# Patient Record
Sex: Male | Born: 1945 | Race: White | Hispanic: No | Smoking: Never smoker
Health system: Southern US, Community
[De-identification: ages and names within clinical notes are randomized; demographics above are authoritative.]

## PROBLEM LIST (undated history)

## (undated) DIAGNOSIS — I6381 Other cerebral infarction due to occlusion or stenosis of small artery: Secondary | ICD-10-CM

## (undated) DIAGNOSIS — I639 Cerebral infarction, unspecified: Secondary | ICD-10-CM

## (undated) HISTORY — PX: BACK SURGERY: SHX140

## (undated) HISTORY — DX: Other cerebral infarction due to occlusion or stenosis of small artery: I63.81

## (undated) HISTORY — PX: KNEE SURGERY: SHX244

## (undated) HISTORY — PX: ANKLE SURGERY: SHX546

## (undated) HISTORY — PX: SHOULDER SURGERY: SHX246

---

## 2005-09-19 DIAGNOSIS — R200 Anesthesia of skin: Secondary | ICD-10-CM

## 2005-09-19 HISTORY — DX: Anesthesia of skin: R20.0

## 2005-11-04 DIAGNOSIS — M47816 Spondylosis without myelopathy or radiculopathy, lumbar region: Secondary | ICD-10-CM | POA: Insufficient documentation

## 2005-11-04 HISTORY — DX: Spondylosis without myelopathy or radiculopathy, lumbar region: M47.816

## 2006-11-26 DIAGNOSIS — M542 Cervicalgia: Secondary | ICD-10-CM

## 2006-11-26 DIAGNOSIS — M9901 Segmental and somatic dysfunction of cervical region: Secondary | ICD-10-CM | POA: Insufficient documentation

## 2006-11-26 DIAGNOSIS — M47812 Spondylosis without myelopathy or radiculopathy, cervical region: Secondary | ICD-10-CM | POA: Insufficient documentation

## 2006-11-26 HISTORY — DX: Spondylosis without myelopathy or radiculopathy, cervical region: M47.812

## 2006-11-26 HISTORY — DX: Segmental and somatic dysfunction of cervical region: M99.01

## 2006-11-26 HISTORY — DX: Cervicalgia: M54.2

## 2008-01-28 DIAGNOSIS — M2550 Pain in unspecified joint: Secondary | ICD-10-CM | POA: Insufficient documentation

## 2008-01-28 HISTORY — DX: Pain in unspecified joint: M25.50

## 2008-03-07 DIAGNOSIS — C4490 Unspecified malignant neoplasm of skin, unspecified: Secondary | ICD-10-CM | POA: Insufficient documentation

## 2008-03-07 DIAGNOSIS — L570 Actinic keratosis: Secondary | ICD-10-CM

## 2008-03-07 HISTORY — DX: Actinic keratosis: L57.0

## 2008-03-07 HISTORY — DX: Unspecified malignant neoplasm of skin, unspecified: C44.90

## 2008-05-23 DIAGNOSIS — D126 Benign neoplasm of colon, unspecified: Secondary | ICD-10-CM | POA: Insufficient documentation

## 2008-05-23 HISTORY — DX: Benign neoplasm of colon, unspecified: D12.6

## 2008-06-01 DIAGNOSIS — M353 Polymyalgia rheumatica: Secondary | ICD-10-CM

## 2008-06-01 HISTORY — DX: Polymyalgia rheumatica: M35.3

## 2008-10-14 DIAGNOSIS — R5383 Other fatigue: Secondary | ICD-10-CM

## 2008-10-14 HISTORY — DX: Other fatigue: R53.83

## 2009-06-14 DIAGNOSIS — M7710 Lateral epicondylitis, unspecified elbow: Secondary | ICD-10-CM

## 2009-06-14 HISTORY — DX: Lateral epicondylitis, unspecified elbow: M77.10

## 2014-04-01 DIAGNOSIS — G4700 Insomnia, unspecified: Secondary | ICD-10-CM

## 2014-04-01 HISTORY — DX: Insomnia, unspecified: G47.00

## 2014-07-18 DIAGNOSIS — D485 Neoplasm of uncertain behavior of skin: Secondary | ICD-10-CM

## 2014-07-18 HISTORY — DX: Neoplasm of uncertain behavior of skin: D48.5

## 2014-08-01 DIAGNOSIS — H269 Unspecified cataract: Secondary | ICD-10-CM | POA: Insufficient documentation

## 2014-11-07 DIAGNOSIS — F1020 Alcohol dependence, uncomplicated: Secondary | ICD-10-CM | POA: Insufficient documentation

## 2014-11-07 DIAGNOSIS — R413 Other amnesia: Secondary | ICD-10-CM | POA: Insufficient documentation

## 2014-11-07 HISTORY — DX: Alcohol dependence, uncomplicated: F10.20

## 2014-11-10 DIAGNOSIS — H2513 Age-related nuclear cataract, bilateral: Secondary | ICD-10-CM

## 2014-11-10 DIAGNOSIS — H25099 Other age-related incipient cataract, unspecified eye: Secondary | ICD-10-CM | POA: Insufficient documentation

## 2014-11-10 HISTORY — DX: Age-related nuclear cataract, bilateral: H25.13

## 2015-06-19 DIAGNOSIS — R6889 Other general symptoms and signs: Secondary | ICD-10-CM | POA: Insufficient documentation

## 2015-10-09 DIAGNOSIS — K648 Other hemorrhoids: Secondary | ICD-10-CM

## 2015-10-09 DIAGNOSIS — K5730 Diverticulosis of large intestine without perforation or abscess without bleeding: Secondary | ICD-10-CM

## 2015-10-09 HISTORY — DX: Diverticulosis of large intestine without perforation or abscess without bleeding: K57.30

## 2015-10-09 HISTORY — DX: Other hemorrhoids: K64.8

## 2016-09-27 DIAGNOSIS — G8929 Other chronic pain: Secondary | ICD-10-CM | POA: Insufficient documentation

## 2016-09-27 HISTORY — DX: Other chronic pain: G89.29

## 2017-06-09 DIAGNOSIS — R29818 Other symptoms and signs involving the nervous system: Secondary | ICD-10-CM | POA: Insufficient documentation

## 2017-11-12 DIAGNOSIS — S66819A Strain of other specified muscles, fascia and tendons at wrist and hand level, unspecified hand, initial encounter: Secondary | ICD-10-CM | POA: Insufficient documentation

## 2017-11-12 DIAGNOSIS — S6440XA Injury of digital nerve of unspecified finger, initial encounter: Secondary | ICD-10-CM

## 2017-11-12 HISTORY — DX: Strain of other specified muscles, fascia and tendons at wrist and hand level, unspecified hand, initial encounter: S66.819A

## 2017-11-12 HISTORY — DX: Injury of digital nerve of unspecified finger, initial encounter: S64.40XA

## 2018-02-03 ENCOUNTER — Encounter (HOSPITAL_COMMUNITY): Payer: Self-pay

## 2018-02-03 ENCOUNTER — Emergency Department (HOSPITAL_COMMUNITY)
Admission: EM | Admit: 2018-02-03 | Discharge: 2018-02-03 | Disposition: A | Discharge status: Home / Self Care | Payer: Medicare Other | Attending: Emergency Medicine | Admitting: Emergency Medicine

## 2018-02-03 ENCOUNTER — Emergency Department (HOSPITAL_COMMUNITY): Payer: Medicare Other

## 2018-02-03 DIAGNOSIS — Y999 Unspecified external cause status: Secondary | ICD-10-CM | POA: Insufficient documentation

## 2018-02-03 DIAGNOSIS — W182XXA Fall in (into) shower or empty bathtub, initial encounter: Secondary | ICD-10-CM | POA: Diagnosis not present

## 2018-02-03 DIAGNOSIS — M542 Cervicalgia: Secondary | ICD-10-CM

## 2018-02-03 DIAGNOSIS — R51 Headache: Secondary | ICD-10-CM | POA: Diagnosis not present

## 2018-02-03 DIAGNOSIS — S0990XA Unspecified injury of head, initial encounter: Secondary | ICD-10-CM

## 2018-02-03 DIAGNOSIS — Y939 Activity, unspecified: Secondary | ICD-10-CM | POA: Diagnosis not present

## 2018-02-03 DIAGNOSIS — Y929 Unspecified place or not applicable: Secondary | ICD-10-CM | POA: Insufficient documentation

## 2018-02-03 DIAGNOSIS — W19XXXA Unspecified fall, initial encounter: Secondary | ICD-10-CM

## 2018-02-03 HISTORY — DX: Cerebral infarction, unspecified: I63.9

## 2018-02-03 MED ORDER — HYDROCODONE-ACETAMINOPHEN 5-325 MG PO TABS: 2.0000 | ORAL_TABLET | ORAL | 0 refills | Status: DC | PRN

## 2018-02-03 MED ORDER — MORPHINE SULFATE (PF) 4 MG/ML IV SOLN
4.0000 mg | Freq: Once | INTRAVENOUS | Status: AC
  Administered 2018-02-03: 4 mg via INTRAMUSCULAR
  Filled 2018-02-03: qty 1

## 2018-02-03 NOTE — ED Provider Notes (Signed)
[facility]-EMERGENCY DEPT Provider Note   CSN: [id number] Arrival date & time: 02/03/18  [id number]     History   Chief Complaint Chief Complaint  Patient presents with  . Fall    HPI Seth ParkinsJohn Bryant is a 72 y.o. male.  72 year old male with prior history as detailed below presents after reported fall.  Patient reports that he fell 3 days prior to struck his head.  He complains of diffuse headache and neck pain following the fall.  He denies loss of conscious.  He denies focal weakness, chest pain, shortness of breath, or other significant injury.  Patient reports that he is in [place] from [place].  He reports that his chronic pain medications are being sent to him by his wife from [place].  He reports that he is out of his current pain medications.  Care everywhere review does reveal recent MRI imaging of his cervical spine in [place] within the last month. No significant abnormality found at that time.   The history is provided by the patient, a relative and medical records.  Fall  This is a new problem. The current episode started more than 2 days ago. The problem occurs rarely. The problem has not changed since onset.Pertinent negatives include no chest pain and no abdominal pain. Exacerbated by: movement. Nothing relieves the symptoms.    Past Medical History:  Diagnosis Date  . mini stroke     There are no active problems to display for this patient.   Past Surgical History:  Procedure Laterality Date  . ANKLE SURGERY    . BACK SURGERY    . KNEE SURGERY    . SHOULDER SURGERY          Home Medications    Prior to Admission medications   Medication Sig Start Date End Date Taking? Authorizing Provider  HYDROcodone-acetaminophen (NORCO/VICODIN) 5-325 MG tablet Take 2 tablets by mouth every 4 (four) hours as needed. 02/03/18   Wynetta FinesMessick, Peter C, MD    Family History History reviewed. No pertinent family  history.  Social History Social History   Tobacco Use  . Smoking status: Never Smoker  . Smokeless tobacco: Never Used  Substance Use Topics  . Alcohol use: Yes  . Drug use: Never     Allergies   Latex   Review of Systems Review of Systems  Cardiovascular: Negative for chest pain.  Gastrointestinal: Negative for abdominal pain.  Musculoskeletal:       Neck pain   All other systems reviewed and are negative.    Physical Exam Updated Vital Signs BP (!) 158/92 (BP Location: Left Arm)   Pulse (!) 53   Temp 97.7 F (36.5 C) (Oral)   Resp 16   Ht 5' 5.5" (1.664 m)   Wt 77.1 kg (170 lb)   SpO2 100%   BMI 27.86 kg/m   Physical Exam  Constitutional: He is oriented to person, place, and time. He appears well-developed and well-nourished. No distress.  HENT:  Head: Normocephalic and atraumatic.  Mouth/Throat: Oropharynx is clear and moist.  Cervical collar in place  Patient reports diffuse posterior and lateral neck tenderness.  Eyes: Pupils are equal, round, and reactive to light. Conjunctivae and EOM are normal.  Neck: Normal range of motion. Neck supple.  Cardiovascular: Normal rate, regular rhythm and normal heart sounds.  Pulmonary/Chest: Effort normal and breath sounds normal. No respiratory distress.  Abdominal: Soft. He exhibits no distension. There is no tenderness.  Musculoskeletal: Normal range  of motion. He exhibits no edema or deformity.  Neurological: He is alert and oriented to person, place, and time.  Alert and oriented x4 Normal speech No facial droop 5 out of 5 strength of both upper and lower extremities   Skin: Skin is warm and dry.  Psychiatric: He has a normal mood and affect.  Nursing note and vitals reviewed.    ED Treatments / Results  Labs (all labs ordered are listed, but only abnormal results are displayed) Labs Reviewed - No data to display  EKG None  Radiology Ct Head Wo Contrast  Result Date: 02/03/2018 CLINICAL DATA:   Fall, hit back of head. EXAM: CT HEAD WITHOUT CONTRAST CT CERVICAL SPINE WITHOUT CONTRAST TECHNIQUE: Multidetector CT imaging of the head and cervical spine was performed following the standard protocol without intravenous contrast. Multiplanar CT image reconstructions of the cervical spine were also generated. COMPARISON:  None. FINDINGS: CT HEAD FINDINGS Brain: Age related volume loss. 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 No acute intracranial abnormality. Specifically, no hemorrhage, hydrocephalus, mass lesion, acute infarction, or significant intracranial injury. Vascular: No hyperdense vessel or unexpected calcification. Skull: No acute calvarial abnormality. Sinuses/Orbits: No acute findings Other: None CT CERVICAL SPINE FINDINGS Alignment: No subluxation Skull base and vertebrae: No fracture Soft tissues and spinal canal: No prevertebral fluid or swelling. No visible canal hematoma. Disc levels: Disc space narrowing at C5-6 through C7-T1. Mild degenerative facet disease bilaterally, left greater than right. Upper chest: No acute findings Other: No acute findings bilateral thyroid nodules noted. IMPRESSION: No acute intracranial abnormality. 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 No acute bony abnormality. 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 Electronically Signed   By: Charlett Nose M.D.   On: 02/03/2018 10:44   Ct Cervical Spine Wo Contrast  Result Date: 02/03/2018 CLINICAL DATA:  Fall, hit back of head. EXAM: CT HEAD WITHOUT CONTRAST CT CERVICAL SPINE WITHOUT CONTRAST TECHNIQUE: Multidetector CT imaging of the head and cervical spine was performed following the standard protocol without intravenous contrast. Multiplanar CT image reconstructions of the cervical spine were also generated. COMPARISON:  None. FINDINGS: CT HEAD FINDINGS Brain: Age related volume loss. 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 No acute intracranial abnormality.  Specifically, no hemorrhage, hydrocephalus, mass lesion, acute infarction, or significant intracranial injury. Vascular: No hyperdense vessel or unexpected calcification. Skull: No acute calvarial abnormality. Sinuses/Orbits: No acute findings Other: None CT CERVICAL SPINE FINDINGS Alignment: No subluxation Skull base and vertebrae: No fracture Soft tissues and spinal canal: No prevertebral fluid or swelling. No visible canal hematoma. Disc levels: Disc space narrowing at C5-6 through C7-T1. Mild degenerative facet disease bilaterally, left greater than right. Upper chest: No acute findings Other: No acute findings bilateral thyroid nodules noted. IMPRESSION: No acute intracranial abnormality. 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 No acute bony abnormality. 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 Electronically Signed   By: Charlett Nose M.D.   On: 02/03/2018 10:44    Procedures Procedures (including critical care time)  Medications Ordered in ED Medications  morphine 4 MG/ML injection 4 mg (4 mg Intramuscular Given 02/03/18 0600)     Initial Impression / Assessment and Plan / ED Course  I have reviewed the triage vital signs and the nursing notes.  Pertinent labs & imaging results that were available during my care of the patient were reviewed by me and considered in my medical decision making (see chart for details).  MDM  Screen complete  Patient is presenting for evaluation following a fall with head injury and neck pain.  Screening CT scans do not reveal significant acute traumatic injury.  Patient is improved following treatment in the ED.  He desires discharge.  Patient will be given a small prescription of narcotics for home use.  It appears that his routine chronic pain meds are still in [place].  Importance of close follow-up was stressed.  Strict return precautions are given and understood.  Final Clinical Impressions(s) / ED  Diagnoses   Final diagnoses:  Fall, initial encounter  Injury of head, initial encounter  Neck pain    ED Discharge Orders        Ordered    HYDROcodone-acetaminophen (NORCO/VICODIN) 5-325 MG tablet  Every 4 hours PRN     02/03/18 1148       Wynetta Fines, MD 02/03/18 1220

## 2018-02-03 NOTE — ED Triage Notes (Signed)
Pt presents with c/o fall that occurred on Saturday attempting to get out of the bath tub. Pt c/o neck pain, holding his neck and in obvious pain upon arrival. C-collar placed on patient as he was barely able to walk without holding onto his neck. Pt reports that any movement is painful.

## 2018-02-03 NOTE — Discharge Instructions (Addendum)
Please return for any problem.  Follow-up with your regular doctor as instructed. °

## 2019-02-11 DIAGNOSIS — L299 Pruritus, unspecified: Secondary | ICD-10-CM | POA: Insufficient documentation

## 2019-03-04 DIAGNOSIS — R972 Elevated prostate specific antigen [PSA]: Secondary | ICD-10-CM

## 2019-03-04 HISTORY — DX: Elevated prostate specific antigen (PSA): R97.20

## 2019-05-06 ENCOUNTER — Emergency Department (HOSPITAL_COMMUNITY): Payer: Medicare Other

## 2019-05-06 ENCOUNTER — Emergency Department (HOSPITAL_COMMUNITY)
Admission: EM | Admit: 2019-05-06 | Discharge: 2019-05-06 | Discharge status: Against Medical Advice | Payer: Medicare Other | Attending: Emergency Medicine | Admitting: Emergency Medicine

## 2019-05-06 ENCOUNTER — Encounter (HOSPITAL_COMMUNITY): Payer: Self-pay

## 2019-05-06 DIAGNOSIS — Z8673 Personal history of transient ischemic attack (TIA), and cerebral infarction without residual deficits: Secondary | ICD-10-CM | POA: Insufficient documentation

## 2019-05-06 DIAGNOSIS — Z5320 Procedure and treatment not carried out because of patient's decision for unspecified reasons: Secondary | ICD-10-CM | POA: Diagnosis not present

## 2019-05-06 DIAGNOSIS — R200 Anesthesia of skin: Secondary | ICD-10-CM | POA: Diagnosis not present

## 2019-05-06 DIAGNOSIS — R42 Dizziness and giddiness: Secondary | ICD-10-CM | POA: Diagnosis not present

## 2019-05-06 LAB — CBC
HCT: 50.8 % (ref 39.0–52.0)
Hemoglobin: 16.6 g/dL (ref 13.0–17.0)
MCH: 30.6 pg (ref 26.0–34.0)
MCHC: 32.7 g/dL (ref 30.0–36.0)
MCV: 93.6 fL (ref 80.0–100.0)
Platelets: 173 10*3/uL (ref 150–400)
RBC: 5.43 MIL/uL (ref 4.22–5.81)
RDW: 13 % (ref 11.5–15.5)
WBC: 6.8 10*3/uL (ref 4.0–10.5)
nRBC: 0 % (ref 0.0–0.2)

## 2019-05-06 LAB — COMPREHENSIVE METABOLIC PANEL
ALT: 20 U/L (ref 0–44)
AST: 26 U/L (ref 15–41)
Albumin: 4.8 g/dL (ref 3.5–5.0)
Alkaline Phosphatase: 81 U/L (ref 38–126)
Anion gap: 10 (ref 5–15)
BUN: 12 mg/dL (ref 8–23)
CO2: 27 mmol/L (ref 22–32)
Calcium: 9.4 mg/dL (ref 8.9–10.3)
Chloride: 101 mmol/L (ref 98–111)
Creatinine, Ser: 1.05 mg/dL (ref 0.61–1.24)
GFR calc Af Amer: >60 mL/min (ref >60)
GFR calc non Af Amer: >60 mL/min (ref >60)
Glucose, Bld: 83 mg/dL (ref 70–99)
Potassium: 4.2 mmol/L (ref 3.5–5.1)
Sodium: 138 mmol/L (ref 135–145)
Total Bilirubin: 0.4 mg/dL (ref 0.3–1.2)
Total Protein: 8.3 g/dL — ABNORMAL HIGH (ref 6.5–8.1)

## 2019-05-06 LAB — DIFFERENTIAL
Abs Immature Granulocytes: 0.03 10*3/uL (ref 0.00–0.07)
Basophils Absolute: 0 10*3/uL (ref 0.0–0.1)
Basophils Relative: 1 %
Eosinophils Absolute: 0 10*3/uL (ref 0.0–0.5)
Eosinophils Relative: 1 %
Immature Granulocytes: 0 %
Lymphocytes Relative: 23 %
Lymphs Abs: 1.6 10*3/uL (ref 0.7–4.0)
Monocytes Absolute: 0.6 10*3/uL (ref 0.1–1.0)
Monocytes Relative: 8 %
Neutro Abs: 4.6 10*3/uL (ref 1.7–7.7)
Neutrophils Relative %: 67 %

## 2019-05-06 LAB — TROPONIN I (HIGH SENSITIVITY): Troponin I (High Sensitivity): 5 ng/L (ref <18)

## 2019-05-06 LAB — PROTIME-INR
INR: 1 (ref 0.8–1.2)
Prothrombin Time: 13.3 seconds (ref 11.4–15.2)

## 2019-05-06 LAB — CBG MONITORING, ED: Glucose-Capillary: 81 mg/dL (ref 70–99)

## 2019-05-06 LAB — APTT: aPTT: 30 seconds (ref 24–36)

## 2019-05-06 MED ORDER — SODIUM CHLORIDE 0.9% FLUSH: 3.0000 mL | Freq: Once | INTRAVENOUS | Status: DC

## 2019-05-06 NOTE — ED Provider Notes (Signed)
[name] Provider Note   CSN: [id number] Arrival date & time: 05/06/19  2043     History   Chief Complaint Numbness in arms  HPI Seth Bryant is a 73 y.o. male presenting to the emergency department with concern for possible mini stroke.  The patient reports that he was at home lying in the bed resting.  He states approximate 1 hour prior to arrival, he began experiencing numbness in his left hand and arm which traveled up to his shoulder.  He also experienced some numbness following this extending into his right arm.  He states this lasted a few minutes and is resolved completely by the time he came to the emergency department.  He states he has had similar symptoms in the past, most recently 5 months ago, where he had the same symptoms as well as numbness and tingling in his left lower face.  He went to an urgent care at the time and was referred to a main hospital ER, but decided to go home as his symptoms had resolved.  His primary care physician subsequently told him that he may have had a mini stroke.  He does not take aspirin or any blood thinners at home.  Tonight after symptoms began he did take 2 baby aspirin prior to coming to the ED.  He denies any associated palpitations, shortness of breath, lightheadedness, chest pain, syncope.  He denies any prior history of stroke or A. Fib.  He does report a distant past that he was told he has "skipped heartbeats".   HPI  Past Medical History:  Diagnosis Date   mini stroke     There are no active problems to display for this patient.   Past Surgical History:  Procedure Laterality Date   ANKLE SURGERY     BACK SURGERY     KNEE SURGERY     SHOULDER SURGERY          Home Medications    Prior to Admission medications   Medication Sig Start Date End Date Taking? Authorizing Provider  HYDROcodone-acetaminophen (NORCO/VICODIN) 5-325 MG tablet Take 2 tablets by mouth every 4 (four)  hours as needed. 02/03/18   Valarie Merino, MD    Family History History reviewed. No pertinent family history.  Social History Social History   Tobacco Use   Smoking status: Never Smoker   Smokeless tobacco: Never Used  Substance Use Topics   Alcohol use: Yes   Drug use: Never     Allergies   Latex   Review of Systems Review of Systems  Constitutional: Negative for chills and fever.  Eyes: Negative for photophobia and visual disturbance.  Respiratory: Negative for cough and shortness of breath.   Cardiovascular: Negative for chest pain and palpitations.  Gastrointestinal: Negative for abdominal pain, nausea and vomiting.  Musculoskeletal: Negative for neck pain and neck stiffness.  Skin: Negative for pallor and rash.  Neurological: Positive for dizziness, weakness, light-headedness and numbness. Negative for syncope, facial asymmetry, speech difficulty and headaches.  Psychiatric/Behavioral: Negative for agitation and confusion.  All other systems reviewed and are negative.    Physical Exam Updated Vital Signs BP 113/70    Pulse 64    Temp 98.3 F (36.8 C) (Oral)    Resp 10    SpO2 99%   Physical Exam Vitals signs and nursing note reviewed.  Constitutional:      Appearance: He is well-developed.  HENT:     Head: Normocephalic and atraumatic.  Eyes:     Conjunctiva/sclera: Conjunctivae normal.  Neck:     Musculoskeletal: Neck supple.  Cardiovascular:     Rate and Rhythm: Normal rate and regular rhythm.     Heart sounds: No murmur.  Pulmonary:     Effort: Pulmonary effort is normal. No respiratory distress.     Breath sounds: Normal breath sounds.  Abdominal:     Palpations: Abdomen is soft.     Tenderness: There is no abdominal tenderness.  Skin:    General: Skin is warm and dry.  Neurological:     General: No focal deficit present.     Mental Status: He is alert and oriented to person, place, and time.     GCS: GCS eye subscore is 4. GCS verbal  subscore is 5. GCS motor subscore is 6.     Cranial Nerves: Cranial nerves are intact.     Sensory: Sensation is intact.     Motor: Motor function is intact.     Coordination: Coordination is intact.     Gait: Gait is intact.  Psychiatric:        Mood and Affect: Mood normal.        Behavior: Behavior normal.      ED Treatments / Results  Labs (all labs ordered are listed, but only abnormal results are displayed) Labs Reviewed  COMPREHENSIVE METABOLIC PANEL - Abnormal; Notable for the following components:      Result Value   Total Protein 8.3 (*)    All other components within normal limits  I-STAT CHEM 8, ED - Abnormal; Notable for the following components:   Potassium 6.9 (*)    Calcium, Ion 1.06 (*)    All other components within normal limits  PROTIME-INR  APTT  CBC  DIFFERENTIAL  CBG MONITORING, ED  TROPONIN I (HIGH SENSITIVITY)    EKG None  Radiology Ct Head Wo Contrast  Result Date: 05/06/2019 CLINICAL DATA:  Initial evaluation for acute numbness, tingling, paresthesias, dizziness. EXAM: CT HEAD WITHOUT CONTRAST TECHNIQUE: Contiguous axial images were obtained from the base of the skull through the vertex without intravenous contrast. COMPARISON:  Prior CT from 02/03/2018. FINDINGS: Brain: Generalized age-related cerebral atrophy with mild chronic small vessel ischemic disease. Few scattered chronic lacunar infarcts noted within the left basal ganglia. No acute intracranial hemorrhage. No acute large vessel territory infarct. No mass lesion, midline shift or mass effect no hydrocephalus. No extra-axial fluid collection. Vascular: No hyperdense vessel. Scattered vascular calcifications noted within the carotid siphons. Skull: Scalp soft tissues and calvarium within normal limits. Sinuses/Orbits: Globes orbital soft tissues within normal limits. Mild scattered mucosal thickening noted within the ethmoidal air cells. Paranasal sinuses are otherwise clear. No mastoid  effusion. Other: None. IMPRESSION: 1. No acute intracranial abnormality. 2. Generalized age-related cerebral atrophy with mild chronic small vessel ischemic disease. Electronically Signed   By: Rise MuBenjamin  McClintock M.D.   On: 05/06/2019 21:42    Procedures Procedures (including critical care time)  Medications Ordered in ED Medications  sodium chloride flush (NS) 0.9 % injection 3 mL (0 mLs Intravenous Hold 05/06/19 2123)     Initial Impression / Assessment and Plan / ED Course  I have reviewed the triage vital signs and the nursing notes.  Pertinent labs & imaging results that were available during my care of the patient were reviewed by me and considered in my medical decision making (see chart for details).  73 year old gentleman presented emergency department with transient episode of numbness in his left  arm radiating up to his shoulder, subsequently crossing over and radiating down his right arm.  This was associated with some brief vertigo and lightheadedness.  This occurred while he was at rest at home.  This occurred approximate 1 hour prior to arrival.  In the ED the patient is asymptomatic and states symptoms have resolved.  CT scan does not show any evidence of acute infarct, possible older lacunar infarcts.  No remarkable findings on initial lab work.  The patient is asymptomatic with benign neurological exam.  He has an ABCD2 score of 2.  However, I think his history is not quite consistent with a TIA.  Bilateral nature of his symptoms makes this less likely.  However I will discuss with neurology, and given his prior recurrent episodes, I do believe it is reasonable to pursue further work-up.  Also on differential is arrhythmia versus acute coronary syndrome versus other.  Clinical Course as of May 06 32  Thu May 06, 2019  2154 Istat K 6.9, suspected lab error, K> 4.2 on CMP   [MT]  2156 IMPRESSION: 1. No acute intracranial abnormality. 2. Generalized age-related  cerebral atrophy with mild chronic small vessel ischemic disease.   [MT]  2246 Patient reports that he does not want a wait overnight for further testing.  He states he is concerned because he has to take care of his 80 year old mother at home.  He states he feels fine now.  I explained to him that if he had a TIA or a cardiac event, this raises his risk of having either a full-blown stroke or a lethal arrhythmia or heart attack.  I told my medical recommendation were to stay in the hospital and have further work-up including h CT angiogram possible MRI of the brain.  He states he understands the risks of  this conditions, including death or permanent disability, but still wishes to go home.  He states he will follow up with his own doctor.  He would like to follow-up with cardiology as an outpatient.  I will start him on baby aspirin in the meantime.  Advised him if he has any recurrent symptoms, he needs to come immediately to the emergency department, and that the longer he waits at home, the greater the risk for irreversible damage.  He states he understand this and still wishes to go home.   [MT]    Clinical Course User Index [MT] Renaye Rakers Kermit Balo, MD     Final Clinical Impressions(s) / ED Diagnoses   Final diagnoses:  Arm numbness left  Right arm numbness  Vertigo    ED Discharge Orders    None       Kenyada Dosch, Kermit Balo, MD 05/07/19 [phone]

## 2019-05-06 NOTE — ED Triage Notes (Signed)
Pt complains of bilateral arm and face numbness about 30 minutes, he states that he's had it before and dx with a TIA, pt doesn't have slurred speech at present

## 2019-05-06 NOTE — ED Notes (Addendum)
Pt. Documented in error NIH Stroke Scale.

## 2019-05-06 NOTE — ED Notes (Signed)
Notified MD of Chem-8 results- Potassium reading 6.55mmol/L. Nancy Nordmann

## 2019-05-06 NOTE — Discharge Instructions (Addendum)
You were seen in the hospital after having an episode at home of numbness in your left arm followed by lightheadedness and dizziness.  As I explained, this presentation could be concerning for a mini stroke, or else an arrhythmia (dangerous rhythm) of your heart.  Even though we did not see any active signs of heart attack or stroke on your scans or work-up, you are still at risk for developing these conditions.  I recommended that he stay in the hospital for an MRI or further imaging of your brain as well as monitoring of your heart.  You stated that you could not stay in the hospital and you wished to go home.  You told us that you understood the risks of leaving before completing your full medical workup, including stroke, heart attack, permanent disability, or death.  I recommended that you start taking aspirin 81 mg daily, and follow up with your doctor for an outpatient MRI.  You should also follow up with a cardiologist for evaluation for your lightheadedness with your episodes.

## 2019-05-07 LAB — I-STAT CHEM 8, ED
BUN: 16 mg/dL (ref 8–23)
Calcium, Ion: 1.06 mmol/L — ABNORMAL LOW (ref 1.15–1.40)
Chloride: 101 mmol/L (ref 98–111)
Creatinine, Ser: 1.1 mg/dL (ref 0.61–1.24)
Glucose, Bld: 80 mg/dL (ref 70–99)
HCT: 44 % (ref 39.0–52.0)
Hemoglobin: 15 g/dL (ref 13.0–17.0)
Potassium: 6.9 mmol/L (ref 3.5–5.1)
Sodium: 136 mmol/L (ref 135–145)
TCO2: 29 mmol/L (ref 22–32)

## 2019-09-23 DIAGNOSIS — L568 Other specified acute skin changes due to ultraviolet radiation: Secondary | ICD-10-CM

## 2019-09-23 DIAGNOSIS — L281 Prurigo nodularis: Secondary | ICD-10-CM | POA: Insufficient documentation

## 2019-09-23 DIAGNOSIS — T50905A Adverse effect of unspecified drugs, medicaments and biological substances, initial encounter: Secondary | ICD-10-CM | POA: Insufficient documentation

## 2019-09-23 HISTORY — DX: Adverse effect of unspecified drugs, medicaments and biological substances, initial encounter: T50.905A

## 2019-09-23 HISTORY — DX: Other specified acute skin changes due to ultraviolet radiation: L56.8

## 2019-09-23 HISTORY — DX: Prurigo nodularis: L28.1

## 2019-11-22 DIAGNOSIS — F1120 Opioid dependence, uncomplicated: Secondary | ICD-10-CM

## 2019-11-22 HISTORY — DX: Opioid dependence, uncomplicated: F11.20

## 2019-12-27 DIAGNOSIS — F1320 Sedative, hypnotic or anxiolytic dependence, uncomplicated: Secondary | ICD-10-CM | POA: Insufficient documentation

## 2019-12-27 HISTORY — DX: Sedative, hypnotic or anxiolytic dependence, uncomplicated: F13.20

## 2020-12-19 DIAGNOSIS — C61 Malignant neoplasm of prostate: Secondary | ICD-10-CM | POA: Insufficient documentation

## 2020-12-19 HISTORY — DX: Malignant neoplasm of prostate: C61

## 2021-01-07 IMAGING — CT CT HEAD W/O CM
3 series · 16 of 47 positions shown, 19 images · non-contrast
Comparison: Prior CT from 02/03/2018.

CLINICAL DATA: Initial evaluation for acute numbness, tingling,
paresthesias, dizziness.

EXAM:
CT HEAD WITHOUT CONTRAST
TECHNIQUE: Contiguous axial images were obtained from the base of the skull
through the vertex without intravenous contrast.

[Series 2: head wo · axial · 0.44mm/px · z∈[+91,+226]mm · 10 of 33 slices shown, 13 images]
[im 3/33  brain]
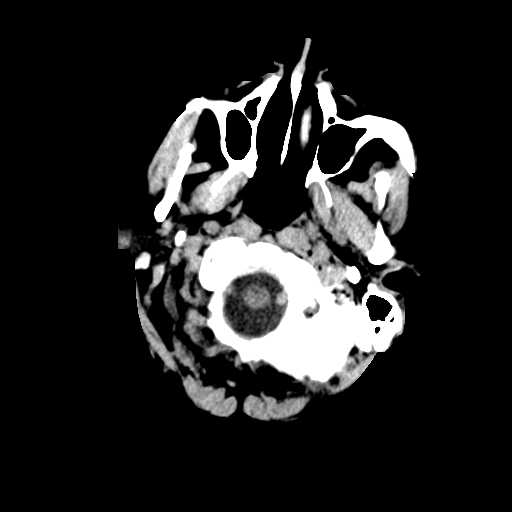
[im 3/33  bone]
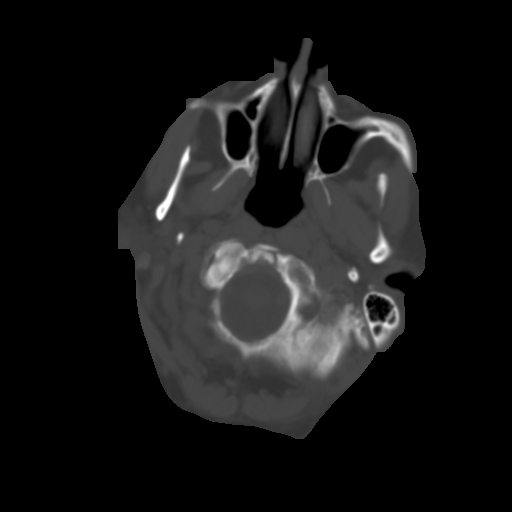
[im 6/33  brain]
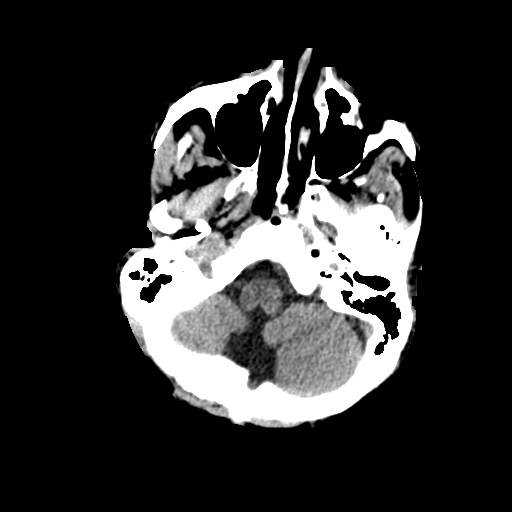
[im 9/33  brain]
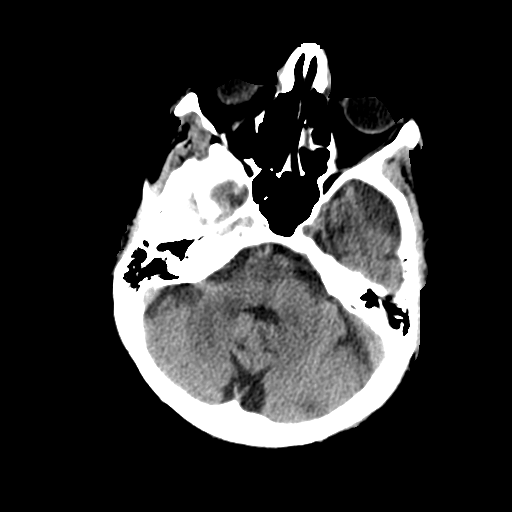
[im 12/33  brain]
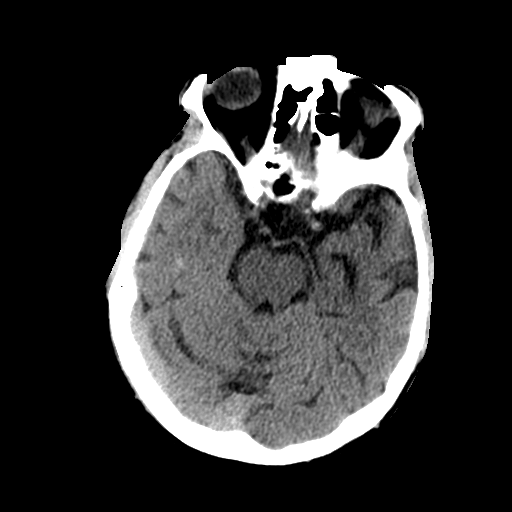
[im 15/33  brain]
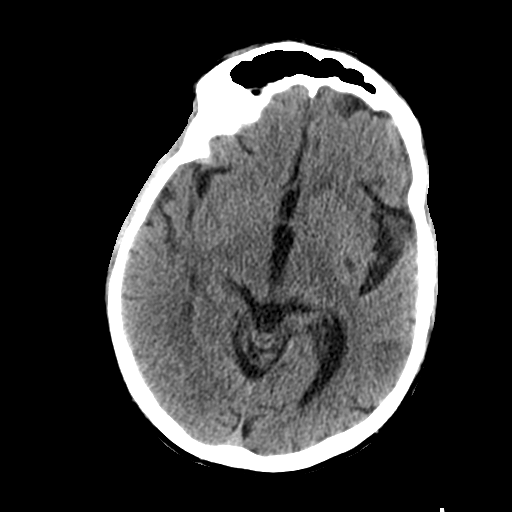
[im 15/33  bone]
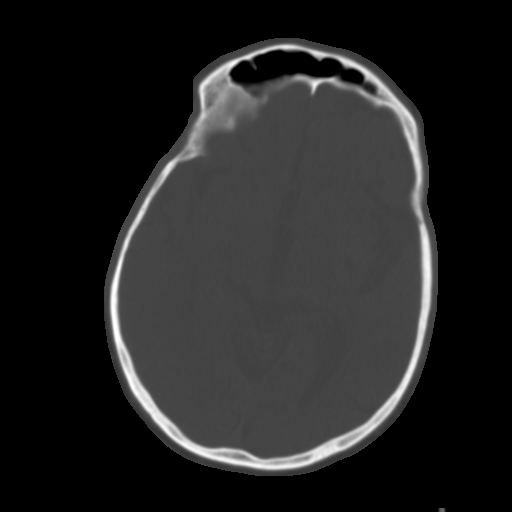
[im 18/33  brain]
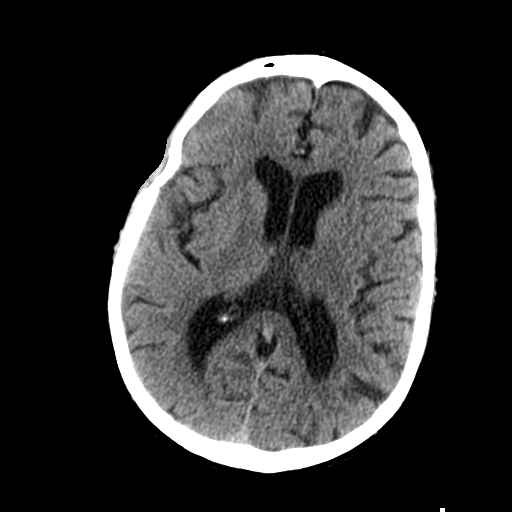
[im 21/33  brain]
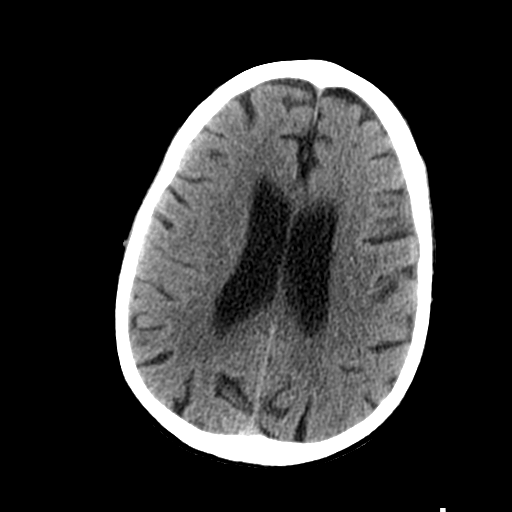
[im 25/33  brain]
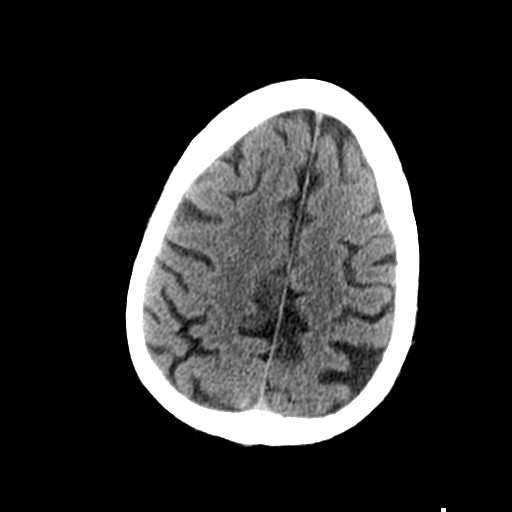
[im 27/33  brain]
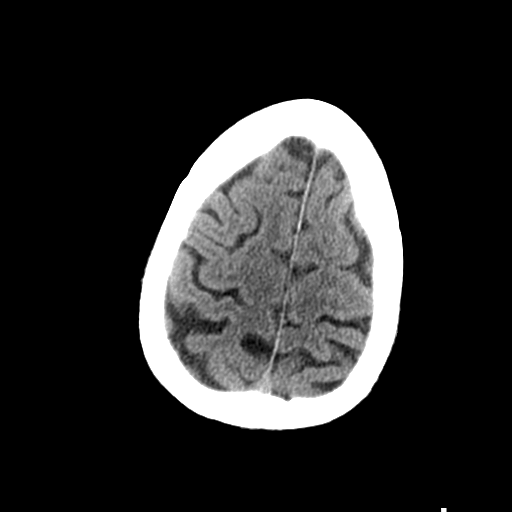
[im 27/33  bone]
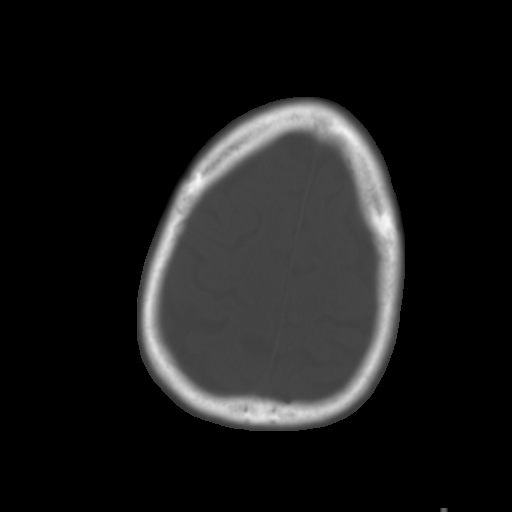
[im 30/33  brain]
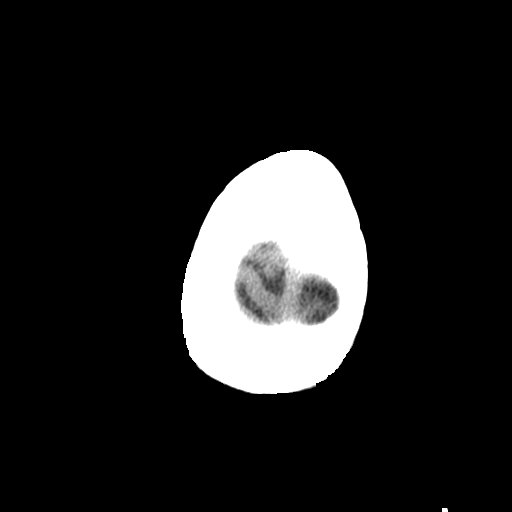

[Series 5: coronal soft tissue · coronal · 0.31mm/px · 3 of 66 slices shown]
[im 22/66  brain]
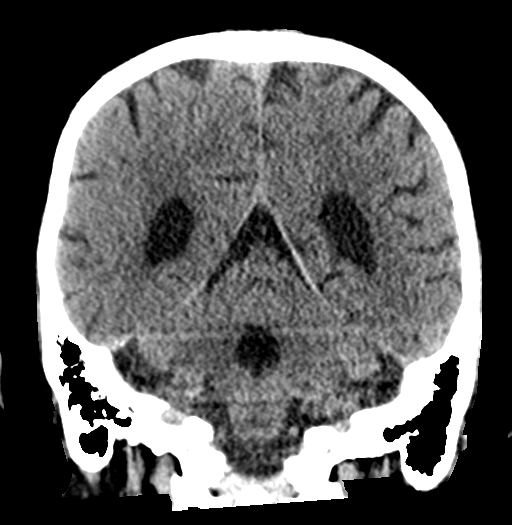
[im 29/66  brain]
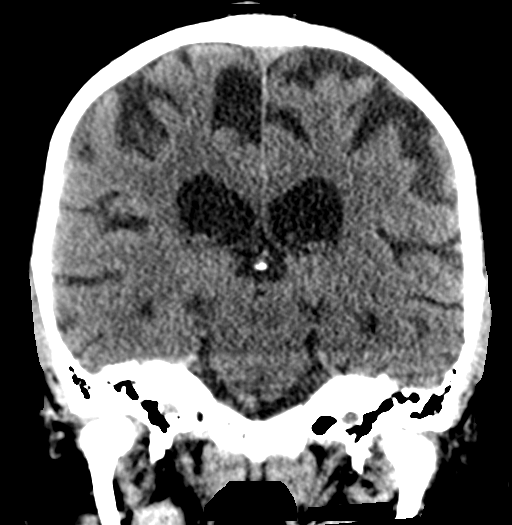
[im 37/66  brain]
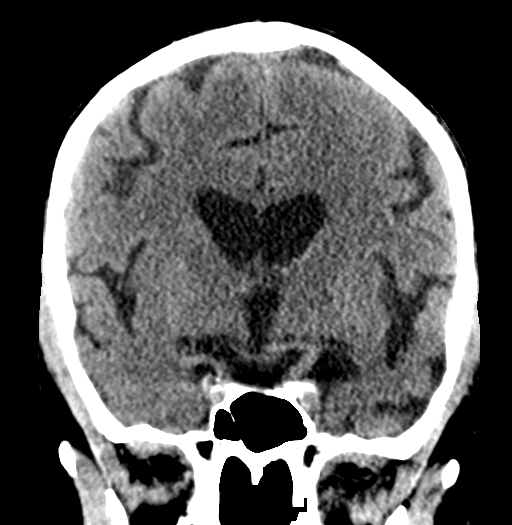

[Series 6: sagittal soft tissue · sagittal · 0.32mm/px · 3 of 54 slices shown]
[im 18/54  brain]
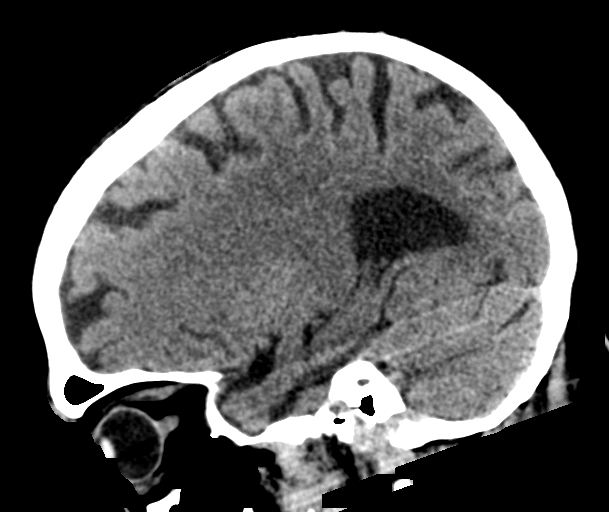
[im 27/54  brain]
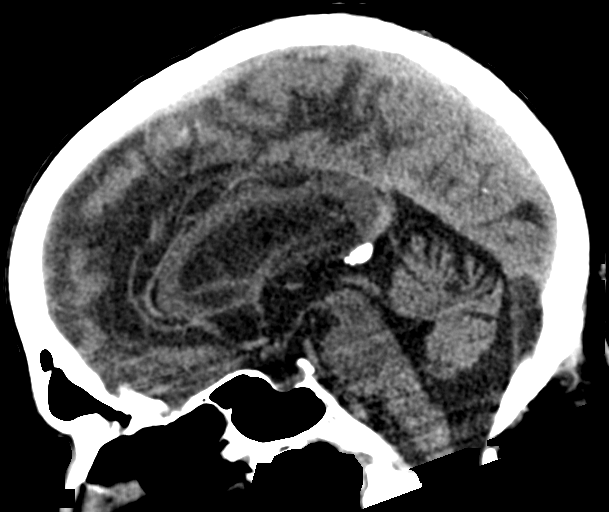
[im 36/54  brain]
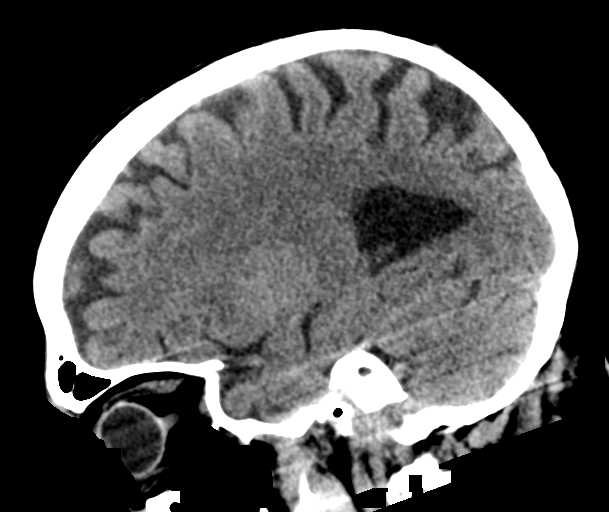

[16 of 47 positions shown; findings below may reference images not displayed]

FINDINGS: Brain: Generalized age-related cerebral atrophy with mild chronic
small vessel ischemic disease. Few scattered chronic lacunar
infarcts noted within the left basal ganglia. No acute intracranial
hemorrhage. No acute large vessel territory infarct. No mass lesion,
midline shift or mass effect no hydrocephalus. No extra-axial fluid
collection.

Vascular: No hyperdense vessel. Scattered vascular calcifications
noted within the carotid siphons.

Skull: Scalp soft tissues and calvarium within normal limits.

Sinuses/Orbits: Globes orbital soft tissues within normal limits.
Mild scattered mucosal thickening noted within the ethmoidal air
cells. Paranasal sinuses are otherwise clear. No mastoid effusion.

Other: None.
IMPRESSION: 1. No acute intracranial abnormality.
2. Generalized age-related cerebral atrophy with mild chronic small
vessel ischemic disease.

## 2022-04-25 DIAGNOSIS — R296 Repeated falls: Secondary | ICD-10-CM

## 2022-04-25 HISTORY — DX: Repeated falls: R29.6

## 2022-09-27 DIAGNOSIS — Z9189 Other specified personal risk factors, not elsewhere classified: Secondary | ICD-10-CM | POA: Insufficient documentation

## 2022-12-19 DIAGNOSIS — D696 Thrombocytopenia, unspecified: Secondary | ICD-10-CM

## 2022-12-19 DIAGNOSIS — D692 Other nonthrombocytopenic purpura: Secondary | ICD-10-CM | POA: Insufficient documentation

## 2022-12-19 DIAGNOSIS — Z66 Do not resuscitate: Secondary | ICD-10-CM | POA: Insufficient documentation

## 2022-12-19 HISTORY — DX: Other nonthrombocytopenic purpura: D69.2

## 2022-12-19 HISTORY — DX: Thrombocytopenia, unspecified: D69.6

## 2023-03-31 DIAGNOSIS — E538 Deficiency of other specified B group vitamins: Secondary | ICD-10-CM | POA: Insufficient documentation

## 2023-03-31 HISTORY — DX: Deficiency of other specified B group vitamins: E53.8

## 2023-04-19 ENCOUNTER — Other Ambulatory Visit: Payer: Self-pay

## 2023-04-19 ENCOUNTER — Emergency Department (HOSPITAL_BASED_OUTPATIENT_CLINIC_OR_DEPARTMENT_OTHER): Payer: Medicare Other

## 2023-04-19 ENCOUNTER — Other Ambulatory Visit (HOSPITAL_BASED_OUTPATIENT_CLINIC_OR_DEPARTMENT_OTHER): Payer: Self-pay

## 2023-04-19 ENCOUNTER — Emergency Department (HOSPITAL_BASED_OUTPATIENT_CLINIC_OR_DEPARTMENT_OTHER)
Admission: EM | Admit: 2023-04-19 | Discharge: 2023-04-19 | Disposition: A | Discharge status: Home / Self Care | Payer: Medicare Other | Attending: Emergency Medicine | Admitting: Emergency Medicine

## 2023-04-19 ENCOUNTER — Encounter (HOSPITAL_BASED_OUTPATIENT_CLINIC_OR_DEPARTMENT_OTHER): Payer: Self-pay

## 2023-04-19 DIAGNOSIS — M542 Cervicalgia: Secondary | ICD-10-CM | POA: Diagnosis present

## 2023-04-19 MED ORDER — METHOCARBAMOL 500 MG PO TABS
500.0000 mg | ORAL_TABLET | Freq: Two times a day (BID) | ORAL | 0 refills | Status: DC | PRN
  Filled 2023-04-19: qty 20, 10d supply, fill #0

## 2023-04-19 MED ORDER — ACETAMINOPHEN 500 MG PO TABS
1000.0000 mg | ORAL_TABLET | Freq: Once | ORAL | Status: AC
  Administered 2023-04-19: 1000 mg via ORAL
  Filled 2023-04-19: qty 2

## 2023-04-19 MED ORDER — LIDOCAINE 5 % EX PTCH
1.0000 | MEDICATED_PATCH | CUTANEOUS | Status: DC
  Administered 2023-04-19: 1 via TRANSDERMAL
  Filled 2023-04-19: qty 1

## 2023-04-19 MED ORDER — KETOROLAC TROMETHAMINE 15 MG/ML IJ SOLN
15.0000 mg | Freq: Once | INTRAMUSCULAR | Status: AC
  Administered 2023-04-19: 15 mg via INTRAMUSCULAR
  Filled 2023-04-19: qty 1

## 2023-04-19 MED ORDER — METHOCARBAMOL 500 MG PO TABS
500.0000 mg | ORAL_TABLET | Freq: Once | ORAL | Status: AC
  Administered 2023-04-19: 500 mg via ORAL
  Filled 2023-04-19: qty 1

## 2023-04-19 NOTE — ED Notes (Signed)
Dc instructions reviewed with patient. Patient voiced understanding. Dc with belongings.  °
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
## 2023-04-19 NOTE — Discharge Instructions (Signed)
You were seen today for neck pain.  Your CT scan was reassuring but did show a small thyroid nodule, you need to follow-up with your primary care provider as you will need an ultrasound to reevaluate this.  You can take the prescribed muscle relaxer but should not drive will take this as it may make you drowsy.  If you develop severe pain, weakness, numbness you should return to the ED.  You should follow-up with your primary care provider.

## 2023-04-19 NOTE — ED Triage Notes (Signed)
Patient arrives with complaints of increased left side neck pain x2 days. Patient reports no recent injuries or falls. Rates pain a 4/10.

## 2023-05-23 ENCOUNTER — Emergency Department (HOSPITAL_COMMUNITY)
Admission: EM | Admit: 2023-05-23 | Discharge: 2023-05-23 | Disposition: A | Discharge status: Home / Self Care | Payer: Medicare Other

## 2023-05-23 ENCOUNTER — Emergency Department (HOSPITAL_COMMUNITY): Payer: Medicare Other

## 2023-05-23 ENCOUNTER — Encounter (HOSPITAL_COMMUNITY): Payer: Self-pay | Admitting: Emergency Medicine

## 2023-05-23 ENCOUNTER — Other Ambulatory Visit: Payer: Self-pay

## 2023-05-23 DIAGNOSIS — M5441 Lumbago with sciatica, right side: Secondary | ICD-10-CM | POA: Insufficient documentation

## 2023-05-23 DIAGNOSIS — N3289 Other specified disorders of bladder: Secondary | ICD-10-CM | POA: Diagnosis not present

## 2023-05-23 DIAGNOSIS — M5442 Lumbago with sciatica, left side: Secondary | ICD-10-CM | POA: Diagnosis not present

## 2023-05-23 DIAGNOSIS — M545 Low back pain, unspecified: Secondary | ICD-10-CM | POA: Diagnosis present

## 2023-05-23 DIAGNOSIS — Z91040 Latex allergy status: Secondary | ICD-10-CM | POA: Diagnosis not present

## 2023-05-23 LAB — URINALYSIS, ROUTINE W REFLEX MICROSCOPIC
Bilirubin Urine: NEGATIVE
Glucose, UA: NEGATIVE mg/dL
Hgb urine dipstick: NEGATIVE
Ketones, ur: NEGATIVE mg/dL
Leukocytes,Ua: NEGATIVE
Nitrite: NEGATIVE
Protein, ur: NEGATIVE mg/dL
Specific Gravity, Urine: 1.006 (ref 1.005–1.030)
pH: 7 (ref 5.0–8.0)

## 2023-05-23 LAB — BASIC METABOLIC PANEL
Anion gap: 7 (ref 5–15)
BUN: 18 mg/dL (ref 8–23)
CO2: 27 mmol/L (ref 22–32)
Calcium: 8.9 mg/dL (ref 8.9–10.3)
Chloride: 101 mmol/L (ref 98–111)
Creatinine, Ser: 0.68 mg/dL (ref 0.61–1.24)
GFR, Estimated: >60 mL/min (ref >60)
Glucose, Bld: 99 mg/dL (ref 70–99)
Potassium: 4.4 mmol/L (ref 3.5–5.1)
Sodium: 135 mmol/L (ref 135–145)

## 2023-05-23 LAB — CBC
HCT: 45.5 % (ref 39.0–52.0)
Hemoglobin: 14.9 g/dL (ref 13.0–17.0)
MCH: 31.9 pg (ref 26.0–34.0)
MCHC: 32.7 g/dL (ref 30.0–36.0)
MCV: 97.4 fL (ref 80.0–100.0)
Platelets: 130 10*3/uL — ABNORMAL LOW (ref 150–400)
RBC: 4.67 MIL/uL (ref 4.22–5.81)
RDW: 13.1 % (ref 11.5–15.5)
WBC: 4.8 10*3/uL (ref 4.0–10.5)
nRBC: 0 % (ref 0.0–0.2)

## 2023-05-23 MED ORDER — KETOROLAC TROMETHAMINE 15 MG/ML IJ SOLN
15.0000 mg | Freq: Once | INTRAMUSCULAR | Status: AC
  Administered 2023-05-23: 15 mg via INTRAVENOUS
  Filled 2023-05-23: qty 1

## 2023-05-23 MED ORDER — METHOCARBAMOL 500 MG PO TABS: 500.0000 mg | ORAL_TABLET | Freq: Two times a day (BID) | ORAL | 0 refills | Status: DC

## 2023-05-23 MED ORDER — SODIUM CHLORIDE 0.9 % IV BOLUS
1000.0000 mL | Freq: Once | INTRAVENOUS | Status: AC
  Administered 2023-05-23: 1000 mL via INTRAVENOUS

## 2023-05-23 MED ORDER — HYDROMORPHONE HCL 1 MG/ML IJ SOLN
0.5000 mg | Freq: Once | INTRAMUSCULAR | Status: AC
  Administered 2023-05-23: 0.5 mg via INTRAVENOUS
  Filled 2023-05-23: qty 1

## 2023-05-23 MED ORDER — NAPROXEN 375 MG PO TABS: 375.0000 mg | ORAL_TABLET | Freq: Two times a day (BID) | ORAL | 0 refills | Status: DC

## 2023-05-23 MED ORDER — METHOCARBAMOL 500 MG PO TABS: 500.0000 mg | ORAL_TABLET | Freq: Two times a day (BID) | ORAL | 0 refills | Status: DC | PRN

## 2023-05-23 MED ORDER — METHOCARBAMOL 500 MG PO TABS
750.0000 mg | ORAL_TABLET | Freq: Once | ORAL | Status: AC
  Administered 2023-05-23: 750 mg via ORAL
  Filled 2023-05-23: qty 2

## 2023-05-23 MED ORDER — OXYCODONE-ACETAMINOPHEN 5-325 MG PO TABS
1.0000 | ORAL_TABLET | Freq: Four times a day (QID) | ORAL | 0 refills | Status: DC | PRN
Start: 2023-05-23 — End: 2023-12-30

## 2023-05-23 MED ORDER — ONDANSETRON HCL 4 MG/2ML IJ SOLN
4.0000 mg | Freq: Once | INTRAMUSCULAR | Status: AC
  Administered 2023-05-23: 4 mg via INTRAVENOUS
  Filled 2023-05-23: qty 2

## 2023-05-23 NOTE — Discharge Instructions (Signed)
Please take the naproxen as prescribed.  If you are still having pain it is okay to take the Percocet.  Do not drive drink alcohol or take the Percocet as may make you drowsy.  You may also try the Robaxin as needed for muscle spasms.  Do not drive or drink alcohol while taking this medication either.  Please follow-up with the neurosurgeon at the number provided.  Please also schedule a follow-up appointment with the urologist for the bladder wall thickening seen on CT scan.  Return to the ER for worsening symptoms.

## 2023-05-23 NOTE — ED Provider Notes (Signed)
[place] EMERGENCY DEPARTMENT AT [facility] Provider Note   CSN: [id number] Arrival date & time: 05/23/23  1027     History {Add pertinent medical, surgical, social history, OB history to HPI:1} Chief Complaint  Patient presents with   Back Pain    Seth Bryant is a 77 y.o. male.  77 year old male with no reported past medical history presenting to the emergency department today with low back pain.  The patient states he has had low back pain that radiates mostly down his right side now for the past 3 days.  The patient reports that this occurred after doing some yard work few days ago.  He states that he has had difficulty ambulating due to the pain.  He denies any specific weakness but states that the pain makes it difficult to walk.  He denies any numbness or tingling.  Denies any bowel or bladder dysfunction and has not had any saddle anesthesia.  Patient came to the ER today due to ongoing symptoms.  He states that he got up this morning of the bathroom and it hurts to stand up straight.   Back Pain      Home Medications Prior to Admission medications   Medication Sig Start Date End Date Taking? Authorizing Provider  HYDROcodone-acetaminophen (NORCO/VICODIN) 5-325 MG tablet Take 2 tablets by mouth every 4 (four) hours as needed. 02/03/18   Wynetta Fines, MD  methocarbamol (ROBAXIN) 500 MG tablet Take 1 tablet (500 mg total) by mouth 2 (two) times daily as needed for muscle spasms. 04/19/23   Laurence Spates, MD      Allergies    Latex    Review of Systems   Review of Systems  Musculoskeletal:  Positive for back pain.  All other systems reviewed and are negative.   Physical Exam Updated Vital Signs BP (!) 109/57 (BP Location: Left Arm)   Pulse 62   Temp 97.7 F (36.5 C) (Oral)   Resp 18   Ht 5\' 6"  (1.676 m)   Wt 59 kg   SpO2 100%   BMI 20.98 kg/m  Physical Exam Vitals and nursing note reviewed.   Gen: NAD Eyes: PERRL, EOMI HEENT: no  oropharyngeal swelling Neck: trachea midline Resp: clear to auscultation bilaterally Card: RRR, no murmurs, rubs, or gallops Abd: nontender, nondistended Extremities: no calf tenderness, no edema MSK: The patient is tender over the mid lumbar spine as well as the right paraspinal region.  Is also very tender over the right ASIS Vascular: 2+ radial pulses bilaterally, 2+ DP pulses bilaterally Neuro: The patient does seem to have equal strength in the bilateral lower extremities but testing this is difficult due to pain Skin: no rashes Psyc: acting appropriately   ED Results / Procedures / Treatments   Labs (all labs ordered are listed, but only abnormal results are displayed) Labs Reviewed  CBC  BASIC METABOLIC PANEL    EKG None  Radiology No results found.  Procedures Procedures  {Document cardiac monitor, telemetry assessment procedure when appropriate:1}  Medications Ordered in ED Medications  HYDROmorphone (DILAUDID) injection 0.5 mg (has no administration in time range)  ondansetron (ZOFRAN) injection 4 mg (has no administration in time range)  methocarbamol (ROBAXIN) tablet 750 mg (has no administration in time range)    ED Course/ Medical Decision Making/ A&P   {   Click here for ABCD2, HEART and other calculatorsREFRESH Note before signing :1}  Medical Decision Making 77 year old male with no reported past medical history presenting to the emergency department today with low back pain.  I will further evaluate the patient here with a CT scan of his lumbar spine and pelvis for further evaluation for pathologic fracture or compression fractures.  I will obtain basic labs here to evaluate patient's renal function to see if he would be amenable to anti-inflammatories for a short course.  I will give the patient Dilaudid and Zofran as well as Robaxin to start.  He does not have any red flag symptoms for cauda equina syndrome or cord  compressing lesion at this time but if his pain persist he may require further imaging if the CT scan is unremarkable and his symptoms do not improve with the medications here.  I will reevaluate for ultimate disposition.  Amount and/or Complexity of Data Reviewed Labs: ordered. Radiology: ordered.  Risk Prescription drug management.   ***  {Document critical care time when appropriate:1} {Document review of labs and clinical decision tools ie heart score, Chads2Vasc2 etc:1}  {Document your independent review of radiology images, and any outside records:1} {Document your discussion with family members, caretakers, and with consultants:1} {Document social determinants of health affecting pt's care:1} {Document your decision making why or why not admission, treatments were needed:1} Final Clinical Impression(s) / ED Diagnoses Final diagnoses:  None    Rx / DC Orders ED Discharge Orders     None

## 2023-05-23 NOTE — ED Triage Notes (Signed)
Patient complaining of mid-lower back pain ongoing for 3 weeks but becoming more severe this morning. Patient states he was unable to make it back into bed after using restroom this evening and was on the ground for 5 hours. Patient states he may have injured it while doing yard work.

## 2023-06-03 ENCOUNTER — Ambulatory Visit (INDEPENDENT_AMBULATORY_CARE_PROVIDER_SITE_OTHER): Payer: Medicare Other | Admitting: Internal Medicine

## 2023-06-03 ENCOUNTER — Encounter: Payer: Self-pay | Admitting: Internal Medicine

## 2023-06-03 VITALS — BP 120/76 | HR 64 | Temp 98.2°F | Ht 66.0 in | Wt 143.0 lb

## 2023-06-03 DIAGNOSIS — M48061 Spinal stenosis, lumbar region without neurogenic claudication: Secondary | ICD-10-CM | POA: Diagnosis not present

## 2023-06-03 DIAGNOSIS — Z0001 Encounter for general adult medical examination with abnormal findings: Secondary | ICD-10-CM

## 2023-06-03 DIAGNOSIS — E041 Nontoxic single thyroid nodule: Secondary | ICD-10-CM

## 2023-06-03 DIAGNOSIS — E538 Deficiency of other specified B group vitamins: Secondary | ICD-10-CM

## 2023-06-03 DIAGNOSIS — F1320 Sedative, hypnotic or anxiolytic dependence, uncomplicated: Secondary | ICD-10-CM

## 2023-06-03 DIAGNOSIS — F5101 Primary insomnia: Secondary | ICD-10-CM

## 2023-06-03 DIAGNOSIS — R413 Other amnesia: Secondary | ICD-10-CM | POA: Diagnosis not present

## 2023-06-03 DIAGNOSIS — F1120 Opioid dependence, uncomplicated: Secondary | ICD-10-CM

## 2023-06-03 MED ORDER — HYDROCODONE-ACETAMINOPHEN 5-325 MG PO TABS
1.0000 | ORAL_TABLET | Freq: Four times a day (QID) | ORAL | 0 refills | Status: DC | PRN
Start: 2023-06-03 — End: 2023-12-30

## 2023-06-03 MED ORDER — ALPRAZOLAM 1 MG PO TABS: 1.0000 mg | ORAL_TABLET | Freq: Two times a day (BID) | ORAL | 2 refills | Status: DC | PRN

## 2023-06-03 NOTE — Patient Instructions (Signed)
Please continue all other medications as before, and refills have been done if requested - the hydrocodone and xanax  Please have the pharmacy call with any other refills you may need.  Please continue your efforts at being more active, low cholesterol diet, and weight control.  You are otherwise up to date with prevention measures today.  Please keep your appointments with your specialists as you may have planned  You will be contacted regarding the referral for: Orthopedic for the back, MRI and Neurology for the memory, and thyroid ultrasound for the right thyroid nodule  No further lab needed today  Please make an Appointment to return in 6 months, or sooner if needed

## 2023-06-03 NOTE — Progress Notes (Unsigned)
Patient ID: Seth Bryant, male   DOB: 1946-01-29, 77 y.o.   MRN: [id number]          Chief Complaint::new patient wellness exam and Establish Care (Would like some refills , would like a Mri on his head )  , memory change / loss, anxiety, insomnia, chronic lbp, right thyroid nodule, low b12       HPI:  Seth Bryant is a 77 y.o. male here for wellness exam; for hep c screen, declines covid booster, for shingrix at pharmacy, o/w up to date                        Also Pt denies chest pain, increased sob or doe, wheezing, orthopnea, PND, increased LE swelling, palpitations, dizziness or syncope.   Pt denies polydipsia, polyuria, or new focal neuro s/s.    Pt denies fever, wt loss, night sweats, loss of appetite, or other constitutional symptoms  Pt continues to have recurring LBP (has hx of lumbar spinal stenosis/DDD) without bowel or bladder change, fever, wt loss,  worsening LE pain/numbness/weakness, gait change or falls, needs hydrocodone bridging tx for now, needs new ortho referral.  Has worsening also anxiety and insomnia possibly worse with worsening back pain.  Denies hyper or hypo thyroid symptoms such as voice, skin or hair change, but thyroid nodule noted 1.7 cm on recent imaging, needs f/u thyroid u/s.  Also wife here to support pt c/o worsening memory loss mild worse than forgetfulness recently, asks for MRI and neuro referral.  Denies worsening depressive symptoms, suicidal ideation, or panic; has ongoing anxiety as well.  Not taking B12.    Wt Readings from Last 3 Encounters:  06/03/23 143 lb (64.9 kg)  05/23/23 130 lb (59 kg)  04/19/23 146 lb (66.2 kg)   BP Readings from Last 3 Encounters:  06/03/23 120/76  05/23/23 122/64  04/19/23 123/76   Immunization History  Administered Date(s) Administered   Fluad Quad(high Dose 65+) 04/25/2022   Influenza, High Dose Seasonal PF 07/14/2017, 03/11/2019, 05/03/2020   PFIZER(Purple Top)SARS-COV-2 Vaccination 09/13/2019, 10/04/2019, 05/03/2020    Pneumococcal Conjugate-13 01/15/2016   Pneumococcal Polysaccharide-23 11/27/2012, 03/31/2017   Tdap 11/27/2012, 11/05/2017   Zoster, Live 04/01/2014   Health Maintenance Due  Topic Date Due   Medicare Annual Wellness (AWV)  Never done   Hepatitis C Screening  Never done   Zoster Vaccines- Shingrix (1 of 2) 10/05/1963   COVID-19 Vaccine (4 - 2023-24 season) 03/09/2023      Past Medical History:  Diagnosis Date   mini stroke    Past Surgical History:  Procedure Laterality Date   ANKLE SURGERY     BACK SURGERY     KNEE SURGERY     SHOULDER SURGERY      reports that he has never smoked. He has never used smokeless tobacco. He reports current alcohol use. He reports that he does not use drugs. family history includes Heart failure in his father. Allergies  Allergen Reactions   Latex    Current Outpatient Medications on File Prior to Visit  Medication Sig Dispense Refill   fluticasone (FLONASE) 50 MCG/ACT nasal spray Place 2 sprays into both nostrils daily.     methocarbamol (ROBAXIN) 500 MG tablet Take 1 tablet (500 mg total) by mouth 2 (two) times daily. 20 tablet 0   methocarbamol (ROBAXIN) 500 MG tablet Take 1 tablet (500 mg total) by mouth 2 (two) times daily as needed for muscle spasms. 20 tablet 0  naproxen (NAPROSYN) 375 MG tablet Take 1 tablet (375 mg total) by mouth 2 (two) times daily. 20 tablet 0   oxyCODONE-acetaminophen (PERCOCET/ROXICET) 5-325 MG tablet Take 1 tablet by mouth every 6 (six) hours as needed for severe pain (pain score 7-10). (Patient not taking: Reported on 06/03/2023) 12 tablet 0   No current facility-administered medications on file prior to visit.        ROS:  All others reviewed and negative.  Objective        PE:  BP 120/76 (BP Location: Right Arm, Patient Position: Sitting, Cuff Size: Normal)   Pulse 64   Temp 98.2 F (36.8 C) (Oral)   Ht 5\' 6"  (1.676 m)   Wt 143 lb (64.9 kg)   SpO2 100%   BMI 23.08 kg/m                  Constitutional: Pt appears in NAD               HENT: Head: NCAT.                Right Ear: External ear normal.                 Left Ear: External ear normal.                Eyes: . Pupils are equal, round, and reactive to light. Conjunctivae and EOM are normal               Nose: without d/c or deformity               Neck: Neck supple. Gross normal ROM               Cardiovascular: Normal rate and regular rhythm.                 Pulmonary/Chest: Effort normal and breath sounds without rales or wheezing.                Abd:  Soft, NT, ND, + BS, no organomegaly               Neurological: Pt is alert. At baseline orientation, motor grossly intact               Skin: Skin is warm. No rashes, no other new lesions, LE edema - none               Psychiatric: Pt behavior is normal without agitation   Micro: none  Cardiac tracings I have personally interpreted today:  none  Pertinent Radiological findings (summarize): none   Lab Results  Component Value Date   WBC 4.8 05/23/2023   HGB 14.9 05/23/2023   HCT 45.5 05/23/2023   PLT 130 (L) 05/23/2023   GLUCOSE 99 05/23/2023   ALT 20 05/06/2019   AST 26 05/06/2019   NA 135 05/23/2023   K 4.4 05/23/2023   CL 101 05/23/2023   CREATININE 0.68 05/23/2023   BUN 18 05/23/2023   CO2 27 05/23/2023   INR 1.0 05/06/2019   Assessment/Plan:  Seth Bryant is a 77 y.o. White or Caucasian [1] male with  has a past medical history of mini stroke.  Encounter for well adult exam with abnormal findings Age and sex appropriate education and counseling updated with regular exercise and diet Referrals for preventative services - none needed Immunizations addressed - none needed Smoking counseling  - none needed Evidence for depression or other mood disorder -  chronic anxiety stable Most recent labs reviewed. I have personally reviewed and have noted: 1) the patient's medical and social history 2) The patient's current medications and supplements 3)  The patient's height, weight, and BMI have been recorded in the chart   Thyroid nodule With incidental nodule on ct - for thyroid u/s  Spinal stenosis of lumbar region For ortho referral  Opium dependence (HCC) For cont'd hydrocodone for, likely will need pain management referral  Memory change Etiology unclear, possible alcohol, med related, has seen neuro, now for MRI brain and f/u new neurology referral  Insomnia Mild to mod, for contd xanax qhs,  to f/u any worsening symptoms or concerns  Benzodiazepine dependence (HCC) For contd xanax prn for now, may need psychiatry or neuropsychology referral  B12 deficiency  Low recently, start oral replacement - b12 1000 mcg qd  Followup: Return in about 6 months (around 12/01/2023).  Seth Barre, MD 06/05/2023 9:31 AM [facility] Internal Medicine

## 2023-06-05 ENCOUNTER — Encounter: Payer: Self-pay | Admitting: Internal Medicine

## 2023-06-05 DIAGNOSIS — E041 Nontoxic single thyroid nodule: Secondary | ICD-10-CM

## 2023-06-05 DIAGNOSIS — F411 Generalized anxiety disorder: Secondary | ICD-10-CM

## 2023-06-05 DIAGNOSIS — M48061 Spinal stenosis, lumbar region without neurogenic claudication: Secondary | ICD-10-CM | POA: Insufficient documentation

## 2023-06-05 DIAGNOSIS — F419 Anxiety disorder, unspecified: Secondary | ICD-10-CM | POA: Insufficient documentation

## 2023-06-05 DIAGNOSIS — Z0001 Encounter for general adult medical examination with abnormal findings: Secondary | ICD-10-CM | POA: Insufficient documentation

## 2023-06-05 HISTORY — DX: Nontoxic single thyroid nodule: E04.1

## 2023-06-05 HISTORY — DX: Spinal stenosis, lumbar region without neurogenic claudication: M48.061

## 2023-06-05 HISTORY — DX: Generalized anxiety disorder: F41.1

## 2023-06-05 NOTE — Assessment & Plan Note (Signed)
For ortho referral

## 2023-06-05 NOTE — Assessment & Plan Note (Signed)
  Low recently, start oral replacement - b12 1000 mcg qd

## 2023-06-05 NOTE — Assessment & Plan Note (Signed)
Mild to mod, for contd xanax qhs,  to f/u any worsening symptoms or concerns

## 2023-06-05 NOTE — Assessment & Plan Note (Signed)
Etiology unclear, possible alcohol, med related, has seen neuro, now for MRI brain and f/u new neurology referral

## 2023-06-05 NOTE — Assessment & Plan Note (Signed)
Age and sex appropriate education and counseling updated with regular exercise and diet Referrals for preventative services - none needed Immunizations addressed - none needed Smoking counseling  - none needed Evidence for depression or other mood disorder - chronic anxiety stable Most recent labs reviewed. I have personally reviewed and have noted: 1) the patient's medical and social history 2) The patient's current medications and supplements 3) The patient's height, weight, and BMI have been recorded in the chart

## 2023-06-05 NOTE — Assessment & Plan Note (Signed)
For contd xanax prn for now, may need psychiatry or neuropsychology referral

## 2023-06-05 NOTE — Assessment & Plan Note (Signed)
With incidental nodule on ct - for thyroid u/s

## 2023-06-05 NOTE — Assessment & Plan Note (Signed)
For cont'd hydrocodone for, likely will need pain management referral

## 2023-06-12 ENCOUNTER — Ambulatory Visit: Payer: Medicare Other | Admitting: Physical Medicine and Rehabilitation

## 2023-06-12 ENCOUNTER — Encounter: Payer: Self-pay | Admitting: Physical Medicine and Rehabilitation

## 2023-06-12 DIAGNOSIS — M5416 Radiculopathy, lumbar region: Secondary | ICD-10-CM | POA: Diagnosis not present

## 2023-06-12 DIAGNOSIS — M47816 Spondylosis without myelopathy or radiculopathy, lumbar region: Secondary | ICD-10-CM | POA: Diagnosis not present

## 2023-06-12 DIAGNOSIS — M48061 Spinal stenosis, lumbar region without neurogenic claudication: Secondary | ICD-10-CM

## 2023-06-12 DIAGNOSIS — S39012A Strain of muscle, fascia and tendon of lower back, initial encounter: Secondary | ICD-10-CM | POA: Diagnosis not present

## 2023-06-12 NOTE — Progress Notes (Signed)
Seth Bryant - 77 y.o. male MRN [id number]  Date of birth: [birth year]  Office Visit Note: Visit Date: 06/12/2023 PCP: Corwin Levins, MD Referred by: Corwin Levins, MD  Subjective: Chief Complaint  Patient presents with   Lower Back - Pain   HPI: Seth Bryant is a 77 y.o. male who comes in today per the request of Dr. Oliver Barre for evaluation of acute on chronic right sided lower back pain, intermittent radiation of pain around to right groin. He recently re-located to [place] from [place]. Chronic lower back issues for many years, pain worsened several weeks ago after racking leaves. His pain is intermittent. He describes pain as sharp and sore sensation, currently rates as 1 out of 10 today. Some relief of pain with home exercise regimen, rest and use of medications. He does take Norco that is prescribed by Dr. Jonny Ruiz. He has not taken Norco lately as his pain is not severe. No history of formal physical therapy/chiropractic treatments. He was recently seen in the emergency department on 05/23/2023 a few days after pain started, recent CT of lumbar spine shows multi level degenerative changes and facet arthropathy. There is also moderate spinal canal stenosis noted at L2-L3. No history of lumbar injections. History of lumbar surgery, 40 plus years ago. States he underwent surgery due to multiple spine fractures after jumping down from scaffolding at work. Patient seems to be fairly active, he enjoys working outside and playing tennis. Patient denies focal weakness, numbness and tingling. No recent trauma or falls.   Patients course is complicated by memory issues, anxiety, chronic pain syndrome. He is currently living with his 54 year old mother. He reports recent separation from his wife in the last couple of months.      Review of Systems  Musculoskeletal:  Positive for back pain and myalgias.  Neurological:  Negative for tingling, sensory change, focal weakness and weakness.  All other  systems reviewed and are negative.  Otherwise per HPI.  Assessment & Plan: Visit Diagnoses:    ICD-10-CM   1. Strain of lumbar region, initial encounter  S39.012A Ambulatory referral to Physical Therapy    2. Lumbar radiculopathy  M54.16 Ambulatory referral to Physical Therapy    3. Facet arthropathy, lumbar  M47.816 Ambulatory referral to Physical Therapy    4. Spinal stenosis of lumbar region without neurogenic claudication  M48.061 Ambulatory referral to Physical Therapy       Plan: Findings:  Acute on chronic right sided lower back pain, intermittent radiation of pain around to right groin. Patients pain has improved over the last few weeks, his discomfort is mild today. He continues with home exercise regimen, rest and use of medication. Patients clinical presentation and exam are consistent with lumbar myofascial strain. Recent CT of lumbar spine does show multi level degenerative changes and facet arthropathy, there is moderate spinal canal stenosis at L2-L3. He does have tenderness to right paraspinal region upon palpation today. I do not feel his symptoms are related to claudication and are more consistent with myofascial strain given acute onset and improvement of symptoms with conservative therapies. Should his pain worsen or change in nature would consider performing lumbar epidural steroid injection, likely interlaminar injection at the level of L2-L3. I placed order for formal physical therapy, I do feel patient would benefit from manual treatments and core strengthening. He can continue chronic pain management with Dr. Jonny Ruiz. No red flag symptoms noted upon exam today.     Meds & Orders:  No orders of the defined types were placed in this encounter.   Orders Placed This Encounter  Procedures   Ambulatory referral to Physical Therapy    Follow-up: Return if symptoms worsen or fail to improve.   Procedures: No procedures performed      Clinical History: Study  Result  Narrative & Impression CLINICAL DATA:  Lumbar compression fracture.   EXAM: CT LUMBAR SPINE WITHOUT CONTRAST   TECHNIQUE: Multidetector CT imaging of the lumbar spine was performed without intravenous contrast administration. Multiplanar CT image reconstructions were also generated.   RADIATION DOSE REDUCTION: This exam was performed according to the departmental dose-optimization program which includes automated exposure control, adjustment of the mA and/or kV according to patient size and/or use of iterative reconstruction technique.   COMPARISON:  None Available.   FINDINGS: Segmentation: 5 lumbar type vertebrae.   Alignment: Normal   Vertebrae: No acute fracture or focal pathologic process. Confluent osteophytes at T11-L2. Lucency through an L2 anterior inferior endplate osteophyte which is chronic appearing. No adjacent soft tissue edema noted.   Paraspinal and other soft tissues: No evidence of perispinal hematoma or masslike finding.   Disc levels: Generalized degenerative endplate and facet spurring. Degenerative foraminal narrowing especially on the right at L3-4 and below and on the left at L4-5, L5-S1. Ridging and ligamentous thickening causes moderate spinal stenosis at L2-3.   IMPRESSION: 1. No acute finding. 2. Lumbar spine degeneration as described.     Electronically Signed   By: Tiburcio Pea M.D.   On: 05/23/2023 10:01   He reports that he has never smoked. He has never used smokeless tobacco. No results for input(s): "HGBA1C", "LABURIC" in the last 8760 hours.  Objective:  VS:  HT:    WT:   BMI:     BP:   HR: bpm  TEMP: ( )  RESP:  Physical Exam Vitals and nursing note reviewed.  HENT:     Head: Normocephalic and atraumatic.     Right Ear: External ear normal.     Left Ear: External ear normal.     Nose: Nose normal.     Mouth/Throat:     Mouth: Mucous membranes are moist.  Eyes:     Extraocular Movements: Extraocular  movements intact.  Cardiovascular:     Rate and Rhythm: Normal rate.     Pulses: Normal pulses.  Pulmonary:     Effort: Pulmonary effort is normal.  Abdominal:     General: Abdomen is flat.  Musculoskeletal:        General: Tenderness present.     Cervical back: Normal range of motion.     Comments: Patient rises from seated position to standing without difficulty. Good lumbar range of motion. No pain noted with facet loading. 5/5 strength noted with bilateral hip flexion, knee flexion/extension, ankle dorsiflexion/plantarflexion and EHL. No clonus noted bilaterally. No pain upon palpation of greater trochanters. No pain with internal/external rotation of bilateral hips. Sensation intact bilaterally. Tenderness noted to right lumbar paraspinal region upon palpation. Negative slump test bilaterally. Ambulates without aid, gait steady.     Skin:    General: Skin is warm and dry.     Capillary Refill: Capillary refill takes less than 2 seconds.  Neurological:     General: No focal deficit present.     Mental Status: He is alert and oriented to person, place, and time.  Psychiatric:        Mood and Affect: Mood normal.  Behavior: Behavior normal.     Ortho Exam  Imaging: No results found.  Past Medical/Family/Surgical/Social History: Medications & Allergies reviewed per EMR, new medications updated. 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   Drug-induced photosensitivity 09/23/2019   Elevated PSA 03/04/2019   Itching 02/11/2019   Digital nerve laceration, finger 11/12/2017   Flexor tendon rupture of hand 11/12/2017   Transient neurological symptoms 06/09/2017   Chronic pain 09/27/2016   Diverticulosis of colon 10/09/2015   Internal hemorrhoid 10/09/2015   Forgetfulness 06/19/2015   Age-related nuclear cataract of both eyes 11/10/2014   Senile incipient cataract 11/10/2014   Alcoholism (HCC) 11/07/2014   Memory change 11/07/2014   Cataract of both eyes 08/01/2014   Neoplasm of uncertain behavior of skin 07/18/2014   Insomnia 04/01/2014   Lateral epicondylitis 06/14/2009   Fatigue 10/14/2008   Polymyalgia rheumatica (HCC) 06/01/2008   Benign neoplasm of large intestine 05/23/2008   Skin cancer 03/07/2008   Actinic keratosis 03/07/2008   Arthralgia of multiple sites 01/28/2008   Cervical spondylosis 11/26/2006   Neck pain 11/26/2006   Somatic dysfunction of cervical region 11/26/2006   Lumbar spondylosis 11/04/2005   Anesthesia of skin 09/19/2005   Past Medical History:  Diagnosis Date   mini stroke    Family History  Problem Relation Age of Onset   Heart failure Father    Past Surgical History:  Procedure Laterality Date   ANKLE SURGERY     BACK SURGERY     KNEE SURGERY     SHOULDER SURGERY     Social History   Occupational History   Not on file  Tobacco Use   Smoking status: Never   Smokeless tobacco: Never  Substance and Sexual Activity   Alcohol use: Yes   Drug use: Never   Sexual activity: Not on file

## 2023-06-13 ENCOUNTER — Ambulatory Visit
Admission: RE | Admit: 2023-06-13 | Discharge: 2023-06-13 | Disposition: A | Discharge status: Home / Self Care | Payer: Medicare Other | Source: Ambulatory Visit | Attending: Internal Medicine | Admitting: Internal Medicine

## 2023-06-13 DIAGNOSIS — E042 Nontoxic multinodular goiter: Secondary | ICD-10-CM | POA: Diagnosis not present

## 2023-06-13 DIAGNOSIS — E041 Nontoxic single thyroid nodule: Secondary | ICD-10-CM

## 2023-06-14 ENCOUNTER — Encounter: Payer: Self-pay | Admitting: Internal Medicine

## 2023-06-20 ENCOUNTER — Encounter: Payer: Self-pay | Admitting: Physician Assistant

## 2023-06-27 ENCOUNTER — Other Ambulatory Visit: Payer: Medicare Other

## 2023-06-29 ENCOUNTER — Ambulatory Visit
Admission: RE | Admit: 2023-06-29 | Discharge: 2023-06-29 | Disposition: A | Discharge status: Home / Self Care | Payer: Medicare Other | Source: Ambulatory Visit | Attending: Internal Medicine | Admitting: Internal Medicine

## 2023-06-29 DIAGNOSIS — R413 Other amnesia: Secondary | ICD-10-CM

## 2023-06-29 DIAGNOSIS — R519 Headache, unspecified: Secondary | ICD-10-CM | POA: Diagnosis not present

## 2023-06-29 DIAGNOSIS — R42 Dizziness and giddiness: Secondary | ICD-10-CM | POA: Diagnosis not present

## 2023-06-29 DIAGNOSIS — G319 Degenerative disease of nervous system, unspecified: Secondary | ICD-10-CM | POA: Diagnosis not present

## 2023-06-30 ENCOUNTER — Encounter: Payer: Self-pay | Admitting: Internal Medicine

## 2023-06-30 ENCOUNTER — Other Ambulatory Visit: Payer: Self-pay | Admitting: Internal Medicine

## 2023-06-30 MED ORDER — ASPIRIN 81 MG PO TBEC: 81.0000 mg | DELAYED_RELEASE_TABLET | Freq: Every day | ORAL | Status: AC

## 2023-07-08 ENCOUNTER — Encounter: Payer: Self-pay | Admitting: Physical Therapy

## 2023-07-08 ENCOUNTER — Ambulatory Visit: Payer: Medicare Other | Admitting: Physical Therapy

## 2023-07-08 DIAGNOSIS — M5459 Other low back pain: Secondary | ICD-10-CM | POA: Diagnosis not present

## 2023-07-08 DIAGNOSIS — R262 Difficulty in walking, not elsewhere classified: Secondary | ICD-10-CM | POA: Diagnosis not present

## 2023-07-08 DIAGNOSIS — M6281 Muscle weakness (generalized): Secondary | ICD-10-CM | POA: Diagnosis not present

## 2023-07-08 NOTE — Therapy (Signed)
 OUTPATIENT PHYSICAL THERAPY THORACOLUMBAR EVALUATION   Patient Name: Seth Bryant MRN: [id number] DOB:Sep 23, 1945, 77 y.o., male Today's Date: 07/08/2023  END OF SESSION:  PT End of Session - 07/08/23 1611     Visit Number 1    Number of Visits 20    Date for PT Re-Evaluation 09/16/23    Authorization Type [employer]    Progress Note Due on Visit 10    PT Start Time 1430    PT Stop Time 1510    PT Time Calculation (min) 40 min    Activity Tolerance Patient tolerated treatment well    Behavior During Therapy WFL for tasks assessed/performed             Past Medical History:  Diagnosis Date   mini stroke    Past Surgical History:  Procedure Laterality Date   ANKLE SURGERY     BACK SURGERY     KNEE SURGERY     SHOULDER SURGERY     Patient Active Problem List   Diagnosis Date Noted   Encounter for well adult exam with abnormal findings 06/05/2023   Thyroid  nodule 06/05/2023   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   Anxiety 06/05/2023   B12 deficiency 03/31/2023   Physician orders for life-sustaining treatment (POLST) form indicates patient wish for do-not-resuscitate status 12/19/2022   Senile purpura (HCC) 12/19/2022   Thrombocytopenia (HCC) 12/19/2022   Driving safety issue 96/77/7975   Frequent falls 04/25/2022   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   Drug-induced photosensitivity 09/23/2019   Elevated PSA 03/04/2019   Itching 02/11/2019   Digital nerve laceration, finger 11/12/2017   Flexor tendon rupture of hand 11/12/2017   Transient neurological symptoms 06/09/2017   Chronic pain 09/27/2016   Diverticulosis of colon 10/09/2015   Internal hemorrhoid 10/09/2015   Forgetfulness 06/19/2015   Age-related nuclear cataract of both eyes 11/10/2014   Senile incipient cataract 11/10/2014   Alcoholism (HCC) 11/07/2014   Memory change 11/07/2014    Cataract of both eyes 08/01/2014   Neoplasm of uncertain behavior of skin 07/18/2014   Insomnia 04/01/2014   Lateral epicondylitis 06/14/2009   Fatigue 10/14/2008   Polymyalgia rheumatica (HCC) 06/01/2008   Benign neoplasm of large intestine 05/23/2008   Skin cancer 03/07/2008   Actinic keratosis 03/07/2008   Arthralgia of multiple sites 01/28/2008   Cervical spondylosis 11/26/2006   Neck pain 11/26/2006   Somatic dysfunction of cervical region 11/26/2006   Lumbar spondylosis 11/04/2005   Anesthesia of skin 09/19/2005    PCP: Norleen Lynwood ORN, MD   REFERRING PROVIDER: Trudy Duwaine BRAVO, NP   REFERRING DIAG:  Diagnosis  S39.012A (ICD-10-CM) - Strain of lumbar region, initial encounter  M54.16 (ICD-10-CM) - Lumbar radiculopathy  M47.816 (ICD-10-CM) - Facet arthropathy, lumbar  M48.061 (ICD-10-CM) - Spinal stenosis of lumbar region without neurogenic claudication    Rationale for Evaluation and Treatment: Rehabilitation  THERAPY DIAG:  Other low back pain  Muscle weakness (generalized)  Difficulty in walking, not elsewhere classified  ONSET DATE: Beginning of October 2024  SUBJECTIVE:  SUBJECTIVE STATEMENT: Pt arriving for evaluation of acute on chronic right sided lower back pain, intermittent radiation of pain around to right groin. Some relief of pain with home exercise regimen. He does take Norco prescribed by his PCP. He was recently seen in the emergency department on 05/23/2023. There is also moderate spinal canal stenosis noted at L2-L3. No history of lumbar injections. History of lumbar surgery, 40 plus years ago. States he underwent surgery due to multiple spine fractures after jumping down from scaffolding at work. Patient seems to be fairly active, he enjoys playing tennis.  Pt  also reporting when he gets up quickly he gets dizzy and this occurs intermittently.   PERTINENT HISTORY:  Ankle surgery, back surgery, knee surgery, shoulder surgery  PAIN:  NPRS scale: 4/10 Pain location: Rt side low back Pain description: sharp pain at times Aggravating factors: with certain movements Relieving factors: resting changing positions  PRECAUTIONS: None  WEIGHT BEARING RESTRICTIONS: No  FALLS:  Has patient fallen in last 6 months? No  LIVING ENVIRONMENT: Lives with: lives with their family Lives in: House/apartment Has following equipment at home: None  OCCUPATION: retired  PLOF: Independent  PATIENT GOALS: play tennis, work outside  Next MD Visit: no f/u currently made with referring provider, f/u with PCP May 2025   OBJECTIVE:   DIAGNOSTIC FINDINGS:  FINDINGS: Segmentation: 5 lumbar type vertebrae.   Alignment: Normal   Vertebrae: No acute fracture or focal pathologic process. Confluent osteophytes at T11-L2. Lucency through an L2 anterior inferior endplate osteophyte which is chronic appearing. No adjacent soft tissue edema noted.   Paraspinal and other soft tissues: No evidence of perispinal hematoma or masslike finding.   Disc levels: Generalized degenerative endplate and facet spurring. Degenerative foraminal narrowing especially on the right at L3-4 and below and on the left at L4-5, L5-S1. Ridging and ligamentous thickening causes moderate spinal stenosis at L2-3.    PATIENT SURVEYS:  07/08/23:  FOTO eval:  58% (predicted 65%)    SCREENING FOR RED FLAGS: Bowel or bladder incontinence: No Cauda equina syndrome: No  COGNITION: Overall cognitive status: WFL normal      SENSATION: WFL    POSTURE:  07/08/23: rounded shoulders and forward head  PALPATION: 07/08/23: TTP: Rt QL and lumbar paraspinals  LUMBAR ROM:    AROM 07/08/23  Flexion 55  Extension 8  Right lateral flexion 10  Left lateral flexion 10  Right  rotation Limited 25%  Left rotation Limited 25%   (Blank rows = not tested)  LOWER EXTREMITY ROM:       Right 07/08/23 supine Left 07/08/23 supine  Hip flexion 104 102  Hip extension    Hip abduction    Hip adduction    Hip internal rotation    Hip external rotation    Knee flexion 138 140  Knee extension    Ankle dorsiflexion    Ankle plantarflexion    Ankle inversion    Ankle eversion     (Blank rows = not tested)  LOWER EXTREMITY MMT:    MMT Right 07/08/23 Left 07/08/23  Hip flexion 4 4+  Hip extension    Hip abduction 5 5  Hip adduction 5 5  Hip internal rotation    Hip external rotation    Knee flexion 5 4+  Knee extension 5 5  Ankle dorsiflexion    Ankle plantarflexion    Ankle inversion    Ankle eversion     (Blank rows = not tested)  LUMBAR SPECIAL  TESTS:  07/08/23: Slump test: Negative  FUNCTIONAL TESTS:  07/08/23: 5 times sit to stand: 8.9 seconds no UE support                                                                                                                                                                                                                     TODAY'S TREATMENT:                                                                                                         DATE: 07/08/23  Therex: HEP instruction/performance c cues for techniques, handout provided.  Trial set performed of each for comprehension and symptom assessment.  See below for exercise list  PATIENT EDUCATION:  Education details: HEP, POC Person educated: Patient Education method: Explanation, Demonstration, Verbal cues, and Handouts Education comprehension: verbalized understanding, returned demonstration, and verbal cues required  HOME EXERCISE PROGRAM: Access Code: [email or web address] URL: [email or web address] Date: 07/08/2023 Prepared by: Delon Lunger  Exercises - Supine Lower Trunk Rotation  - 2 x daily - 7 x weekly - 3 reps - 30  seconds hold - Supine Bridge  - 2 x daily - 7 x weekly - 2 sets - 10 reps - 5 se conds hold - Hooklying Single Knee to Chest Stretch  - 2 x daily - 7 x weekly - 3 reps - 10 seconds hold - Standing Lumbar Extension at Wall - Forearms  - 2 x daily - 7 x weekly - 5 reps - 10 seconds hold  ASSESSMENT:  CLINICAL IMPRESSION: Patient is a 77 y.o. who comes to clinic with complaints of chronic right side low back pain with mobility, strength and movement coordination deficits that impair their ability to perform usual daily and recreational functional activities without increase difficulty/symptoms at this time. Pt also stating he wishes to return to playing tennis. Pt also reporting dizziness with standing quickly which would need to be assessed further at next visit.  Patient to benefit from skilled PT services to address impairments and limitations to improve to previous level of  function without restriction secondary to condition.   OBJECTIVE IMPAIRMENTS: decreased activity tolerance, decreased mobility, decreased ROM, decreased strength, and pain.   ACTIVITY LIMITATIONS: lifting, bending, standing, squatting, and sleeping  PARTICIPATION LIMITATIONS: community activity, yard work, and recreation  PERSONAL FACTORS: 3+ comorbidities: see pertinent history  are also affecting patient's functional outcome.   REHAB POTENTIAL: Good  CLINICAL DECISION MAKING: Stable/uncomplicated  EVALUATION COMPLEXITY: Low   GOALS: Goals reviewed with patient? Yes  SHORT TERM GOALS: (target date for Short term goals are 3 weeks 07/29/2023)  1. Patient will demonstrate independent use of home exercise program to maintain progress from in clinic treatments.  Goal status: New  LONG TERM GOALS: (target dates for all long term goals are 10 weeks  09/16/2023 )   1. Patient will demonstrate/report pain at worst less than or equal to 2/10 to facilitate minimal limitation in daily activity secondary to pain  symptoms.  Goal status: New   2. Patient will demonstrate independent use of home exercise program to facilitate ability to maintain/progress functional gains from skilled physical therapy services.  Goal status: New   3. Patient will demonstrate FOTO outcome > or = 65 % to indicate reduced disability due to condition.  Goal status: New   4. Patient will demonstrate lumbar extension >/= 15 degrees s symptoms to facilitate upright standing, walking posture at PLOF s limitation.  Goal status: New   5.  Pt will be able to demonstrate correct body mechanics when lifting 20# from floor to counter height with back pain </= 2/10.   Goal status: New   6.  Pt will be able to return to  Goal status: New     PLAN:  PT FREQUENCY: 1-2x/week  PT DURATION: 10 weeks  PLANNED INTERVENTIONS: Can include 02853- PT Re-evaluation, 97110-Therapeutic exercises, 97530- Therapeutic activity, [id number]- Neuromuscular re-education, 97535- Self Care, 97140- Manual therapy, [id number]- Gait training, [phone]- Orthotic Fit/training, [phone]- Canalith repositioning, [id number]- Aquatic Therapy, 97014- Electrical stimulation (unattended), [id number]- Electrical stimulation (manual), [id number]- Vasopneumatic device, [id number]- Ultrasound, [id number]- Traction (mechanical), [id number]- Ionotophoresis 4mg /ml Dexamethasone, Patient/Family education, Balance training, Stair training, Taping, Dry Needling, Joint mobilization, Joint manipulation, Spinal manipulation, Spinal mobilization, Scar mobilization, Vestibular training, Visual/preceptual remediation/compensation, DME instructions, Cryotherapy, and Moist heat.  All performed as medically necessary.  All included unless contraindicated  PLAN FOR NEXT SESSION: Review HEP knowledge/results, assess orthostatic BP vs vestibular, Lumbar ROM, Assess dynamic balance (BERG)         Delon JONELLE Lunger, PT, MPT 07/08/2023, 4:12 PM  Date of referral: 06/12/23 Referring provider: Trudy Duwaine BRAVO,  NP   Referring diagnosis?  Diagnosis  S39.012A (ICD-10-CM) - Strain of lumbar region, initial encounter  M54.16 (ICD-10-CM) - Lumbar radiculopathy  M47.816 (ICD-10-CM) - Facet arthropathy, lumbar  M48.061 (ICD-10-CM) - Spinal stenosis of lumbar region without neurogenic claudication   Treatment diagnosis? (if different than referring diagnosis) M54.59, M62.81, R26.2  What was this (referring dx) caused by? Ongoing Issue  Lysle of Condition: Initial Onset (within last 3 months)   Laterality: Rt  Current Functional Measure Score: FOTO 58%  Objective measurements identify impairments when they are compared to normal values, the uninvolved extremity, and prior level of function.  [x]  Yes  []  No  Objective assessment of functional ability: Moderate functional limitations   Briefly describe symptoms: Rt sided low back pain which radiates into Rt hip and thigh at times. Pt also experiencing dizziness with initial standing with LOB posteriorly.  How did symptoms start: After yard work at beginning of  October 2024  Average pain intensity:  Last 24 hours: 4/10  Past week: 5-6/10 depending on activity  How often does the pt experience symptoms? Frequently  How much have the symptoms interfered with usual daily activities? Moderately  How has condition changed since care began at this facility? NA - initial visit  In general, how is the patients overall health? Good   BACK PAIN (STarT Back Screening Tool) Has pain spread down the leg(s) at some time in the last 2 weeks? yes Has there been pain in the shoulder or neck at some time in the last 2 weeks? Pt stating when he turns his head pain extends down his entire spine Has the pt only walked short distances because of back pain? At times Has patient dressed more slowly because of back pain in the past 2 weeks? no Has patient stopped enjoying things they usually enjoy? Yes, pt can't play tennis and perform certain household  chores Overall, how bothersome has back pain been in the last 2 weeks?                    Very Much

## 2023-07-13 NOTE — Progress Notes (Signed)
 Assessment/Plan:     Seth Bryant is a very pleasant 78 y.o. 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 MoCA today is 15/30.  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  No acute findings.  Etiology is unclear, although Alzheimer's disease may be in the differential.  Memory Impairment   Neurocognitive testing to further evaluate cognitive concerns and determine other underlying cause of memory changes, including potential contribution from sleep, anxiety, attention, or depression among others Monitor the use of pain medications and muscle relaxers which can contribute to memory decline. Replenish B12 Recommend good control of cardiovascular risk factors.   Continue to control mood as per PCP Recommend discontinuing Xanax , he will discuss with his PCP other agents (i.e. trazodone) recommend psychotherapy for situational stress Continue PT with orthopedics to improve mobility and strength Referral to vestibular therapy for benign positional vertigo [place] up in 4 months  Subjective:    The patient is accompanied by his mother (19 yrs old) who supplements the history.    How long did patient have memory difficulties?  For about 6 months, worse over the last month. Iittle things patient reports some difficulty remembering new information, recent conversations, names.  He has recently moved from [place]  to live with his mother after his recent divorce 4 months ago. Repeats oneself?  Endorsed Disoriented when walking into a room?  Patient denies Leaving objects in  unusual places?  denies   Wandering behavior?  Denies Any personality changes, or depression, anxiety?  He has a history of anxiety, situational stress caring for his 41 year old mother and a recent separation from his wife 4 months ago.  Hallucinations or paranoia?  Not recently.  3 years ago he may have seen a shadow, a black figure but he states that was his wife instead.  There were no recurrences. Seizures? Denies.    Any sleep changes?  Does not sleep well if he does not take Xanax . Denies vivid dreams, REM behavior or sleepwalking  Sleep apnea? Denies.   Any hygiene concerns?  Denies.   Independent of bathing and dressing? Denies  Does the patient need help with medications? Patient is in charge   Who is in charge of the finances?  Mother is in charge     Any changes in appetite?   Denies.     Patient have trouble swallowing?  Denies.   Does the patient cook? No  Any headaches?  He has intermittent tension headaches  Chronic pain?  He has a history of intermittent right lower back pain which is chronic, sees ortho care. Ambulates with difficulty?  Tries to remain active, walks outside, plays tennis. Recent falls or head injuries? Denies.     Vision changes?  Denies any new issues.   Any strokelike symptoms? Denies.   Any tremors? Denies.  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 Denies.   Any bowel dysfunction? Denies.      Patient lives with his mother History of heavy alcohol intake?  Denies any alcohol History of heavy tobacco use? Denies.   Family history of dementia?  Denies  Does patient drive? No   B12 September 2024 was 197, CBC November 2024 unremarkable except for platelets of 130.  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   fluticasone (FLONASE) 50 MCG/ACT nasal spray 2 sprays, Daily   HYDROcodone -acetaminophen  (NORCO/VICODIN) 5-325 MG tablet 1 tablet, Oral, Every 6 hours PRN   memantine  (NAMENDA ) 5 MG tablet Take 1 tablet (5 mg at night) for 2 weeks, then increase to 1 tablet (5 mg) twice a day   methocarbamol  (ROBAXIN ) 500 mg, Oral, 2 times daily   methocarbamol  (ROBAXIN ) 500 mg, Oral, 2 times daily PRN   naproxen  (NAPROSYN ) 375 mg, Oral, 2 times daily   oxyCODONE -acetaminophen  (PERCOCET/ROXICET) 5-325 MG tablet 1 tablet, Oral, Every 6 hours PRN     VITALS:   Vitals:   07/17/23 1305  BP: 123/74  Pulse: 84  Resp: 18  SpO2: 95%  Weight: 144 lb (65.3 kg)  Height: 5' 6 (1.676 m)      PHYSICAL EXAM   HEENT:  Normocephalic, atraumatic.  The superficial temporal arteries are without ropiness or tenderness. Cardiovascular: Regular rate and rhythm. Lungs: Clear to auscultation bilaterally. Neck: There are no carotid bruits noted bilaterally.  NEUROLOGICAL:    07/17/2023    1:00 PM  Montreal Cognitive Assessment   Visuospatial/ Executive (0/5) 1  Naming (0/3) 3  Attention: Read list of digits (0/2) 2  Attention: Read list of letters (0/1) 1  Attention: Serial 7 subtraction starting at 100 (0/3) 2  Language: Repeat phrase (0/2) 0  Language : Fluency (0/1) 1  Abstraction (0/2) 1  Delayed Recall (0/5) 1  Orientation (0/6) 3  Total 15  Adjusted Score (based on education) 15        No data to display           Orientation:  Alert and oriented to person, not to place or time. No aphasia or dysarthria. Fund of knowledge is appropriate. Recent and remote memory impaired.  Attention and concentration are reduced.  Able to name objects and unable to repeat phrases. Delayed recall 1/5 Cranial nerves: There is good facial symmetry.  Extraocular muscles are intact and visual fields are full to confrontational testing. Speech is fluent and clear.  Verbose and tangential at times.  No tongue deviation. Hearing is intact to conversational tone. Tone: Tone is good throughout. Sensation: Sensation is intact to light touch.  Vibration is intact at the bilateral big toe.  Coordination: The patient has no difficulty with RAM's or FNF bilaterally. Normal finger to nose  Motor: Strength is 5/5 in the bilateral upper and lower extremities. There is no pronator drift. There are no fasciculations noted. DTR's: Deep tendon reflexes are 2/4 bilaterally. Gait and Station: The patient is able to ambulate without difficulty. Gait is cautious and narrow. Stride length is normal        Thank you for allowing us  the opportunity to participate in the care of this nice patient. Please do not hesitate to contact us  for any questions or concerns.   Total time spent on today's visit was 60 minutes dedicated to this  patient today, preparing to see patient, examining the patient, ordering tests and/or medications and counseling the patient, documenting clinical information in the EHR or other health record, independently interpreting results and communicating results to the patient/family, discussing treatment and goals, answering patient's questions and coordinating care.  Cc:  Norleen Lynwood ORN, MD  Camie Sevin 07/17/2023 6:22 PM

## 2023-07-17 ENCOUNTER — Ambulatory Visit: Payer: Medicare Other | Admitting: Physician Assistant

## 2023-07-17 ENCOUNTER — Encounter: Payer: Self-pay | Admitting: Physician Assistant

## 2023-07-17 VITALS — BP 123/74 | HR 84 | Resp 18 | Ht 66.0 in | Wt 144.0 lb

## 2023-07-17 DIAGNOSIS — R413 Other amnesia: Secondary | ICD-10-CM | POA: Diagnosis not present

## 2023-07-17 DIAGNOSIS — H8110 Benign paroxysmal vertigo, unspecified ear: Secondary | ICD-10-CM | POA: Diagnosis not present

## 2023-07-17 MED ORDER — MEMANTINE HCL 5 MG PO TABS: ORAL_TABLET | ORAL | 11 refills | Status: DC

## 2023-07-17 NOTE — Patient Instructions (Signed)
 It was a pleasure to see you today at our office.   Recommendations:  Neurocognitive evaluation at our office    Start Memantine  5mg  tablets.  Take 1 tablet at bedtime for 2 weeks, then 1 tablet twice daily.    Follow up in 4 months  Referral to vestibular therapy for benign positional vertigo  Recommend primary doctor to give you something for sleep other than xanax  ( for ex trazodone)  Recommend psychotherapy    For psychiatric meds, mood meds: Please have your primary care physician manage these medications.  If you have any severe symptoms of a stroke, or other severe issues such as confusion,severe chills or fever, etc call 911 or go to the ER as you may need to be evaluated further    For assessment of decision of mental capacity and competency:  Call Dr. Rosaline Nine, geriatric psychiatrist at [phone]  Counseling regarding caregiver distress, including caregiver depression, anxiety and issues regarding community resources, adult day care programs, adult living facilities, or memory care questions:  please contact your  Primary Doctor's Social Worker   Whom to call: Memory  decline, memory medications: Call our office [phone]    [email or web address]   RECOMMENDATIONS FOR ALL PATIENTS WITH MEMORY PROBLEMS: 1. Continue to exercise (Recommend 30 minutes of walking everyday, or 3 hours every week) 2. Increase social interactions - continue going to [place] and enjoy social gatherings with friends and family 3. Eat healthy, avoid fried foods and eat more fruits and vegetables 4. Maintain adequate blood pressure, blood sugar, and blood cholesterol level. Reducing the risk of stroke and cardiovascular disease also helps promoting better memory. 5. Avoid stressful situations. Live a simple life and avoid aggravations. Organize your time and prepare for the next day in anticipation. 6. Sleep well, avoid any interruptions of  sleep and avoid any distractions in the bedroom that may interfere with adequate sleep quality 7. Avoid sugar, avoid sweets as there is a strong link between excessive sugar intake, diabetes, and cognitive impairment We discussed the Mediterranean diet, which has been shown to help patients reduce the risk of progressive memory disorders and reduces cardiovascular risk. This includes eating fish, eat fruits and green leafy vegetables, nuts like almonds and hazelnuts, walnuts, and also use olive oil. Avoid fast foods and fried foods as much as possible. Avoid sweets and sugar as sugar use has been linked to worsening of memory function.  There is always a concern of gradual progression of memory problems. If this is the case, then we may need to adjust level of care according to patient needs. Support, both to the patient and caregiver, should then be put into place.      You have been referred for a neuropsychological evaluation (i.e., evaluation of memory and thinking abilities). Please bring someone with you to this appointment if possible, as it is helpful for the doctor to hear from both you and another adult who knows you well. Please bring eyeglasses and hearing aids if you wear them.    The evaluation will take approximately 3 hours and has two parts:   The first part is a clinical interview with the neuropsychologist (Dr. Richie or Dr. Jackquline). During the interview, the neuropsychologist will speak with you and the individual you brought to the appointment.    The second part of the evaluation is testing with the doctor's technician Neal or Luke). During the testing, the technician will ask you to remember different types of material, solve problems, and  answer some questionnaires. Your family member will not be present for this portion of the evaluation.   Please note: We must reserve several hours of the neuropsychologist's time and the psychometrician's time for your evaluation  appointment. As such, there is a No-Show fee of $100. If you are unable to attend any of your appointments, please contact our office as soon as possible to reschedule.      DRIVING: Regarding driving, in patients with progressive memory problems, driving will be impaired. We advise to have someone else do the driving if trouble finding directions or if minor accidents are reported. Independent driving assessment is available to determine safety of driving.   If you are interested in the driving assessment, you can contact the following:  [facility] in [place] [phone]  [facility] [phone]  [facility] [phone]  [facility] [phone] or [phone]   FALL PRECAUTIONS: Be cautious when walking. Scan the area for obstacles that may increase the risk of trips and falls. When getting up in the mornings, sit up at the edge of the bed for a few minutes before getting out of bed. Consider elevating the bed at the head end to avoid drop of blood pressure when getting up. Walk always in a well-lit room (use night lights in the walls). Avoid area rugs or power cords from appliances in the middle of the walkways. Use a walker or a cane if necessary and consider physical therapy for balance exercise. Get your eyesight checked regularly.  FINANCIAL OVERSIGHT: Supervision, especially oversight when making financial decisions or transactions is also recommended.  HOME SAFETY: Consider the safety of the kitchen when operating appliances like stoves, microwave oven, and blender. Consider having supervision and share cooking responsibilities until no longer able to participate in those. Accidents with firearms and other hazards in the house should be identified and addressed as well.   ABILITY TO BE LEFT ALONE: If patient is unable to contact 911 operator, consider using LifeLine, or when the need is there, arrange for someone to stay with  patients. Smoking is a fire hazard, consider supervision or cessation. Risk of wandering should be assessed by caregiver and if detected at any point, supervision and safe proof recommendations should be instituted.  MEDICATION SUPERVISION: Inability to self-administer medication needs to be constantly addressed. Implement a mechanism to ensure safe administration of the medications.      Mediterranean Diet A Mediterranean diet refers to food and lifestyle choices that are based on the traditions of countries located on the [place]. This way of eating has been shown to help prevent certain conditions and improve outcomes for people who have chronic diseases, like kidney disease and heart disease. What are tips for following this plan? Lifestyle  Cook and eat meals together with your family, when possible. Drink enough fluid to keep your urine clear or pale yellow. Be physically active every day. This includes: Aerobic exercise like running or swimming. Leisure activities like gardening, walking, or housework. Get 7-8 hours of sleep each night. If recommended by your health care provider, drink red wine in moderation. This means 1 glass a day for nonpregnant women and 2 glasses a day for men. A glass of wine equals 5 oz (150 mL). Reading food labels  Check the serving size of packaged foods. For foods such as rice and pasta, the serving size refers to the amount of cooked product, not dry. Check the total fat in packaged foods. Avoid foods that  have saturated fat or trans fats. Check the ingredients list for added sugars, such as corn syrup. Shopping  At the grocery store, buy most of your food from the areas near the walls of the store. This includes: Fresh fruits and vegetables (produce). Grains, beans, nuts, and seeds. Some of these may be available in unpackaged forms or large amounts (in bulk). Fresh seafood. Poultry and eggs. Low-fat dairy products. Buy whole ingredients  instead of prepackaged foods. Buy fresh fruits and vegetables in-season from local farmers markets. Buy frozen fruits and vegetables in resealable bags. If you do not have access to quality fresh seafood, buy precooked frozen shrimp or canned fish, such as tuna, salmon, or sardines. Buy small amounts of raw or cooked vegetables, salads, or olives from the deli or salad bar at your store. Stock your pantry so you always have certain foods on hand, such as olive oil, canned tuna, canned tomatoes, rice, pasta, and beans. Cooking  Cook foods with extra-virgin olive oil instead of using butter or other vegetable oils. Have meat as a side dish, and have vegetables or grains as your main dish. This means having meat in small portions or adding small amounts of meat to foods like pasta or stew. Use beans or vegetables instead of meat in common dishes like chili or lasagna. Experiment with different cooking methods. Try roasting or broiling vegetables instead of steaming or sauteing them. Add frozen vegetables to soups, stews, pasta, or rice. Add nuts or seeds for added healthy fat at each meal. You can add these to yogurt, salads, or vegetable dishes. Marinate fish or vegetables using olive oil, lemon juice, garlic, and fresh herbs. Meal planning  Plan to eat 1 vegetarian meal one day each week. Try to work up to 2 vegetarian meals, if possible. Eat seafood 2 or more times a week. Have healthy snacks readily available, such as: Vegetable sticks with hummus. Greek yogurt. Fruit and nut trail mix. Eat balanced meals throughout the week. This includes: Fruit: 2-3 servings a day Vegetables: 4-5 servings a day Low-fat dairy: 2 servings a day Fish, poultry, or lean meat: 1 serving a day Beans and legumes: 2 or more servings a week Nuts and seeds: 1-2 servings a day Whole grains: 6-8 servings a day Extra-virgin olive oil: 3-4 servings a day Limit red meat and sweets to only a few servings a  month What are my food choices? Mediterranean diet Recommended Grains: Whole-grain pasta. Brown rice. Bulgar wheat. Polenta. Couscous. Whole-wheat bread. [place]. Vegetables: Artichokes. Beets. Broccoli. Cabbage. Carrots. Eggplant. Green beans. Chard. Kale. Spinach. Onions. Leeks. Peas. Squash. Tomatoes. Peppers. Radishes. Fruits: Apples. Apricots. Avocado. Berries. Bananas. Cherries. Dates. Figs. Grapes. Lemons. Melon. Oranges. Peaches. Plums. Pomegranate. Meats and other protein foods: Beans. Almonds. Sunflower seeds. Pine nuts. Peanuts. Cod. Salmon. Scallops. Shrimp. Tuna. Tilapia. Clams. Oysters. Eggs. Dairy: Low-fat milk. Cheese. Greek yogurt. Beverages: Water. Red wine. Herbal tea. Fats and oils: Extra virgin olive oil. Avocado oil. Grape seed oil. Sweets and desserts: Greek yogurt with honey. Baked apples. Poached pears. Trail mix. Seasoning and other foods: Basil. Cilantro. Coriander. Cumin. Mint. Parsley. Sage. Rosemary. Tarragon. Garlic. Oregano. Thyme. Pepper. Balsalmic vinegar. Tahini. Hummus. Tomato sauce. Olives. Mushrooms. Limit these Grains: Prepackaged pasta or rice dishes. Prepackaged cereal with added sugar. Vegetables: Deep fried potatoes (french fries). Fruits: Fruit canned in syrup. Meats and other protein foods: Beef. Pork. Lamb. Poultry with skin. Hot dogs. [place]. Dairy: Ice cream. Sour cream. Whole milk. Beverages: Juice. Sugar-sweetened soft drinks. Beer.  Liquor and spirits. Fats and oils: Butter. Canola oil. Vegetable oil. Beef fat (tallow). Lard. Sweets and desserts: Cookies. Cakes. Pies. Candy. Seasoning and other foods: Mayonnaise. Premade sauces and marinades. The items listed may not be a complete list. Talk with your dietitian about what dietary choices are right for you. Summary The Mediterranean diet includes both food and lifestyle choices. Eat a variety of fresh fruits and vegetables, beans, nuts, seeds, and whole grains. Limit the amount of red  meat and sweets that you eat. Talk with your health care provider about whether it is safe for you to drink red wine in moderation. This means 1 glass a day for nonpregnant women and 2 glasses a day for men. A glass of wine equals 5 oz (150 mL). This information is not intended to replace advice given to you by your health care provider. Make sure you discuss any questions you have with your health care provider. Document Released: 02/15/2016 Document Revised: 03/19/2016 Document Reviewed: 02/15/2016 Elsevier Interactive Patient Education  2017 [employer].

## 2023-07-18 ENCOUNTER — Ambulatory Visit: Payer: Medicare Other | Admitting: Physical Therapy

## 2023-07-18 ENCOUNTER — Encounter: Payer: Self-pay | Admitting: Physical Therapy

## 2023-07-18 DIAGNOSIS — M5459 Other low back pain: Secondary | ICD-10-CM

## 2023-07-18 DIAGNOSIS — M6281 Muscle weakness (generalized): Secondary | ICD-10-CM

## 2023-07-18 DIAGNOSIS — R262 Difficulty in walking, not elsewhere classified: Secondary | ICD-10-CM

## 2023-07-18 NOTE — Therapy (Signed)
 OUTPATIENT PHYSICAL THERAPY THORACOLUMBAR TREATMENT    Patient Name: Seth Bryant MRN: [id number] DOB:Mar 24, 1946, 78 y.o., male Today's Date: 07/18/2023  END OF SESSION:  PT End of Session - 07/18/23 1146     Visit Number 2    Number of Visits 20    Date for PT Re-Evaluation 09/16/23    Authorization Type [employer]    Progress Note Due on Visit 10    PT Start Time 1102    PT Stop Time 1141    PT Time Calculation (min) 39 min    Activity Tolerance Patient tolerated treatment well    Behavior During Therapy WFL for tasks assessed/performed              Past Medical History:  Diagnosis Date   mini stroke    Past Surgical History:  Procedure Laterality Date   ANKLE SURGERY     BACK SURGERY     KNEE SURGERY     SHOULDER SURGERY     Patient Active Problem List   Diagnosis Date Noted   Encounter for well adult exam with abnormal findings 06/05/2023   Thyroid  nodule 06/05/2023   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   Anxiety 06/05/2023   B12 deficiency 03/31/2023   Physician orders for life-sustaining treatment (POLST) form indicates patient wish for do-not-resuscitate status 12/19/2022   Senile purpura (HCC) 12/19/2022   Thrombocytopenia (HCC) 12/19/2022   Driving safety issue 96/77/7975   Frequent falls 04/25/2022   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   Drug-induced photosensitivity 09/23/2019   Elevated PSA 03/04/2019   Itching 02/11/2019   Digital nerve laceration, finger 11/12/2017   Flexor tendon rupture of hand 11/12/2017   Transient neurological symptoms 06/09/2017   Chronic pain 09/27/2016   Diverticulosis of colon 10/09/2015   Internal hemorrhoid 10/09/2015   Forgetfulness 06/19/2015   Age-related nuclear cataract of both eyes 11/10/2014   Senile incipient cataract 11/10/2014   Alcoholism (HCC) 11/07/2014   Memory change  11/07/2014   Cataract of both eyes 08/01/2014   Neoplasm of uncertain behavior of skin 07/18/2014   Insomnia 04/01/2014   Lateral epicondylitis 06/14/2009   Fatigue 10/14/2008   Polymyalgia rheumatica (HCC) 06/01/2008   Benign neoplasm of large intestine 05/23/2008   Skin cancer 03/07/2008   Actinic keratosis 03/07/2008   Arthralgia of multiple sites 01/28/2008   Cervical spondylosis 11/26/2006   Neck pain 11/26/2006   Somatic dysfunction of cervical region 11/26/2006   Lumbar spondylosis 11/04/2005   Anesthesia of skin 09/19/2005    PCP: Norleen Lynwood ORN, MD   REFERRING PROVIDER: Trudy Duwaine BRAVO, NP   REFERRING DIAG:  Diagnosis  S39.012A (ICD-10-CM) - Strain of lumbar region, initial encounter  M54.16 (ICD-10-CM) - Lumbar radiculopathy  M47.816 (ICD-10-CM) - Facet arthropathy, lumbar  M48.061 (ICD-10-CM) - Spinal stenosis of lumbar region without neurogenic claudication    Rationale for Evaluation and Treatment: Rehabilitation  THERAPY DIAG:  Other low back pain  Muscle weakness (generalized)  Difficulty in walking, not elsewhere classified  ONSET DATE: Beginning of October 2024  SUBJECTIVE:  SUBJECTIVE STATEMENT:  Same thing since Douglas saw me last time. I need to see a dentist I think my appointment is tomorrow. My back has been an issue for a long time, I've had a dizziness thing that's been an issue for a long time too. I'm all over the place when I get up to go to the bathroom at night. Sometimes it feels like I'm floating in the middle of the room, I'm getting up and everything is going around me. Before I get to that part, if I'm laying down in the bed it looks like things are moving around me. Turning my head quickly makes it worse. Sometimes I wake up and my  head is spinning just  at rest. I'm not doing my HEP because its physically impossible for me to do them, I'd like to see you do them because I'm sure you can't either.      EVAL: Pt arriving for evaluation of acute on chronic right sided lower back pain, intermittent radiation of pain around to right groin. Some relief of pain with home exercise regimen. He does take Norco prescribed by his PCP. He was recently seen in the emergency department on 05/23/2023. There is also moderate spinal canal stenosis noted at L2-L3. No history of lumbar injections. History of lumbar surgery, 40 plus years ago. States he underwent surgery due to multiple spine fractures after jumping down from scaffolding at work. Patient seems to be fairly active, he enjoys playing tennis.  Pt also reporting when he gets up quickly he gets dizzy and this occurs intermittently.   PERTINENT HISTORY:  Ankle surgery, back surgery, knee surgery, shoulder surgery  PAIN:  NPRS scale: 4/10 Pain location: Rt side low back Pain description: sharp pain at times Aggravating factors: with certain movements Relieving factors: resting changing positions  PRECAUTIONS: None  WEIGHT BEARING RESTRICTIONS: No  FALLS:  Has patient fallen in last 6 months? No  LIVING ENVIRONMENT: Lives with: lives with their family Lives in: House/apartment Has following equipment at home: None  OCCUPATION: retired  PLOF: Independent  PATIENT GOALS: play tennis, work outside  Next MD Visit: no f/u currently made with referring provider, f/u with PCP May 2025   OBJECTIVE:   DIAGNOSTIC FINDINGS:  FINDINGS: Segmentation: 5 lumbar type vertebrae.   Alignment: Normal   Vertebrae: No acute fracture or focal pathologic process. Confluent osteophytes at T11-L2. Lucency through an L2 anterior inferior endplate osteophyte which is chronic appearing. No adjacent soft tissue edema noted.   Paraspinal and other soft tissues: No evidence of perispinal hematoma or  masslike finding.   Disc levels: Generalized degenerative endplate and facet spurring. Degenerative foraminal narrowing especially on the right at L3-4 and below and on the left at L4-5, L5-S1. Ridging and ligamentous thickening causes moderate spinal stenosis at L2-3.    PATIENT SURVEYS:  07/08/23:  FOTO eval:  58% (predicted 65%)    SCREENING FOR RED FLAGS: Bowel or bladder incontinence: No Cauda equina syndrome: No  COGNITION: Overall cognitive status: WFL normal      SENSATION: WFL    POSTURE:  07/08/23: rounded shoulders and forward head  PALPATION: 07/08/23: TTP: Rt QL and lumbar paraspinals  LUMBAR ROM:    AROM 07/08/23  Flexion 55  Extension 8  Right lateral flexion 10  Left lateral flexion 10  Right rotation Limited 25%  Left rotation Limited 25%   (Blank rows = not tested)  LOWER EXTREMITY ROM:       Right 07/08/23 supine  Left 07/08/23 supine  Hip flexion 104 102  Hip extension    Hip abduction    Hip adduction    Hip internal rotation    Hip external rotation    Knee flexion 138 140  Knee extension    Ankle dorsiflexion    Ankle plantarflexion    Ankle inversion    Ankle eversion     (Blank rows = not tested)  LOWER EXTREMITY MMT:    MMT Right 07/08/23 Left 07/08/23  Hip flexion 4 4+  Hip extension    Hip abduction 5 5  Hip adduction 5 5  Hip internal rotation    Hip external rotation    Knee flexion 5 4+  Knee extension 5 5  Ankle dorsiflexion    Ankle plantarflexion    Ankle inversion    Ankle eversion     (Blank rows = not tested)  LUMBAR SPECIAL TESTS:  07/08/23: Slump test: Negative  FUNCTIONAL TESTS:  07/08/23: 5 times sit to stand: 8.9 seconds no UE support                                                                                                                                                                                                                     TODAY'S TREATMENT:                                                                                                          DATE:    07/18/23  Worked up dizziness- vestibular testing and orthostatics     07/18/23 0001  Symptom Behavior  Subjective history of current problem see subjective section at beginning of note  Type of Dizziness  Imbalance;Spinning;Funny feeling in head;Lightheadedness  Frequency of Dizziness every night when I go to sleep/when I wake up but not constant  Duration of Dizziness starts off strong and gets slowly better, maybe within 30 minute I don't recognize it  Symptom Nature Spontaneous;Motion provoked  Aggravating Factors Mornings;Turning body quickly;Turning head quickly;Sit to stand  Relieving Factors Comments (goes away by itself in  general)  Progression of Symptoms No change since onset  Oculomotor Exam  Oculomotor Alignment Normal  Ocular ROM WNL  Spontaneous Absent  Gaze-induced  Age appropriate nystagmus at end range  Head shaking Horizontal Absent  Head Shaking Vertical Absent  Smooth Pursuits Intact  Saccades Intact  Comment approximation test WNL for age  Vestibulo-Ocular Reflex  VOR 1 Head Only (x 1 viewing) WNL  VOR Cancellation Normal  Comment vor cancellation limited due to back pain  Positional Testing  Dix-Hallpike  (unable to test effectiely due to limited cervical ROM)  Horizontal Canal Testing  (unable to test effectively due to poor cervical ROM)  Orthostatics  BP supine (x 5 minutes) 126/72 (x3 minutes supine)  HR supine (x 5 minutes) 50  BP sitting 136/81  HR sitting 53  BP standing (after 1 minute) 123/73  HR standing (after 1 minute) 54  BP standing (after 3 minutes) 135/75  HR standing (after 3 minutes) 55      07/08/23  Therex: HEP instruction/performance c cues for techniques, handout provided.  Trial set performed of each for comprehension and symptom assessment.  See below for exercise list  PATIENT EDUCATION:  Education details: HEP,  POC Person educated: Patient Education method: Explanation, Demonstration, Verbal cues, and Handouts Education comprehension: verbalized understanding, returned demonstration, and verbal cues required  HOME EXERCISE PROGRAM: Access Code: [email or web address] URL: [email or web address] Date: 07/08/2023 Prepared by: Delon Lunger  Exercises - Supine Lower Trunk Rotation  - 2 x daily - 7 x weekly - 3 reps - 30 seconds hold - Supine Bridge  - 2 x daily - 7 x weekly - 2 sets - 10 reps - 5 se conds hold - Hooklying Single Knee to Chest Stretch  - 2 x daily - 7 x weekly - 3 reps - 10 seconds hold - Standing Lumbar Extension at Wall - Forearms  - 2 x daily - 7 x weekly - 5 reps - 10 seconds hold  ASSESSMENT:  CLINICAL IMPRESSION:   Focused today's session on work up of dizziness as per POC. See data table above for outcomes/objective findings. Orthostatics negative, his dizziness is not consistent in nature and difficult to diagnose today especially since canal testing was not possible due to very limited cervical ROM. Impulsive during PT treatment session, suspect this is baseline but did have some unique behaviors such as forcefully hitting his head with his hand to try to force more range of motion and suddenly jumping up from mat table and fast stepping sideways and forward/backward to try to shake the dizziness into being.  Unsure of root cause of dizziness unfortunately as all possible tests were WNL and reported symptoms are not consistent with BPPV.   He has not been compliant with HEP and finds it very difficult and gave multiple reasons as to why no one can do this , we ran out of time today due to nature of vestibular testing and OH testing, will take a look at this next visit.      EVAL: Patient is a 78 y.o. who comes to clinic with complaints of chronic right side low back pain with mobility, strength and movement coordination deficits that impair their ability to perform  usual daily and recreational functional activities without increase difficulty/symptoms at this time. Pt also stating he wishes to return to playing tennis. Pt also reporting dizziness with standing quickly which would need to be assessed further at next visit.  Patient to benefit from skilled PT services to address  impairments and limitations to improve to previous level of function without restriction secondary to condition.   OBJECTIVE IMPAIRMENTS: decreased activity tolerance, decreased mobility, decreased ROM, decreased strength, and pain.   ACTIVITY LIMITATIONS: lifting, bending, standing, squatting, and sleeping  PARTICIPATION LIMITATIONS: community activity, yard work, and recreation  PERSONAL FACTORS: 3+ comorbidities: see pertinent history  are also affecting patient's functional outcome.   REHAB POTENTIAL: Good  CLINICAL DECISION MAKING: Stable/uncomplicated  EVALUATION COMPLEXITY: Low   GOALS: Goals reviewed with patient? Yes  SHORT TERM GOALS: (target date for Short term goals are 3 weeks 07/29/2023)  1. Patient will demonstrate independent use of home exercise program to maintain progress from in clinic treatments.  Goal status: New  LONG TERM GOALS: (target dates for all long term goals are 10 weeks  09/16/2023 )   1. Patient will demonstrate/report pain at worst less than or equal to 2/10 to facilitate minimal limitation in daily activity secondary to pain symptoms.  Goal status: New   2. Patient will demonstrate independent use of home exercise program to facilitate ability to maintain/progress functional gains from skilled physical therapy services.  Goal status: New   3. Patient will demonstrate FOTO outcome > or = 65 % to indicate reduced disability due to condition.  Goal status: New   4. Patient will demonstrate lumbar extension >/= 15 degrees s symptoms to facilitate upright standing, walking posture at PLOF s limitation.  Goal status: New   5.  Pt  will be able to demonstrate correct body mechanics when lifting 20# from floor to counter height with back pain </= 2/10.   Goal status: New   6.  Pt will be able to return to  Goal status: New     PLAN:  PT FREQUENCY: 1-2x/week  PT DURATION: 10 weeks  PLANNED INTERVENTIONS: Can include 02853- PT Re-evaluation, 97110-Therapeutic exercises, 97530- Therapeutic activity, [id number]- Neuromuscular re-education, 97535- Self Care, 97140- Manual therapy, [id number]- Gait training, [phone]- Orthotic Fit/training, [phone]- Canalith repositioning, [id number]- Aquatic Therapy, 97014- Electrical stimulation (unattended), [id number]- Electrical stimulation (manual), [id number]- Vasopneumatic device, [id number]- Ultrasound, [id number]- Traction (mechanical), [id number]- Ionotophoresis 4mg /ml Dexamethasone, Patient/Family education, Balance training, Stair training, Taping, Dry Needling, Joint mobilization, Joint manipulation, Spinal manipulation, Spinal mobilization, Scar mobilization, Vestibular training, Visual/preceptual remediation/compensation, DME instructions, Cryotherapy, and Moist heat.  All performed as medically necessary.  All included unless contraindicated  PLAN FOR NEXT SESSION: Review HEP knowledge/results Lumbar ROM, Assess dynamic balance (BERG), revise HEP as he reports he can't do it (its impossible for anyone to do, didn't have time to address it today)        Josette Rough, PT, DPT 07/18/23 11:47 AM   Date of referral: 06/12/23 Referring provider: Trudy Duwaine BRAVO, NP   Referring diagnosis?  Diagnosis  S39.012A (ICD-10-CM) - Strain of lumbar region, initial encounter  M54.16 (ICD-10-CM) - Lumbar radiculopathy  M47.816 (ICD-10-CM) - Facet arthropathy, lumbar  M48.061 (ICD-10-CM) - Spinal stenosis of lumbar region without neurogenic claudication   Treatment diagnosis? (if different than referring diagnosis) M54.59, M62.81, R26.2  What was this (referring dx) caused by? Ongoing Issue  Lysle of Condition:  Initial Onset (within last 3 months)   Laterality: Rt  Current Functional Measure Score: FOTO 58%  Objective measurements identify impairments when they are compared to normal values, the uninvolved extremity, and prior level of function.  [x]  Yes  []  No  Objective assessment of functional ability: Moderate functional limitations   Briefly describe symptoms: Rt sided low back pain which radiates into Rt hip  and thigh at times. Pt also experiencing dizziness with initial standing with LOB posteriorly.  How did symptoms start: After yard work at beginning of October 2024  Average pain intensity:  Last 24 hours: 4/10  Past week: 5-6/10 depending on activity  How often does the pt experience symptoms? Frequently  How much have the symptoms interfered with usual daily activities? Moderately  How has condition changed since care began at this facility? NA - initial visit  In general, how is the patients overall health? Good   BACK PAIN (STarT Back Screening Tool) Has pain spread down the leg(s) at some time in the last 2 weeks? yes Has there been pain in the shoulder or neck at some time in the last 2 weeks? Pt stating when he turns his head pain extends down his entire spine Has the pt only walked short distances because of back pain? At times Has patient dressed more slowly because of back pain in the past 2 weeks? no Has patient stopped enjoying things they usually enjoy? Yes, pt can't play tennis and perform certain household chores Overall, how bothersome has back pain been in the last 2 weeks?                    Very Much

## 2023-07-18 NOTE — Progress Notes (Signed)
   07/18/23 0001  Symptom Behavior  Subjective history of current problem see subjective section at beginning of note  Type of Dizziness  Imbalance;Spinning;Funny feeling in head;Lightheadedness  Frequency of Dizziness every night when I go to sleep/when I wake up but not constant  Duration of Dizziness starts off strong and gets slowly better, maybe within 30 minute I don't recognize it  Symptom Nature Spontaneous;Motion provoked  Aggravating Factors Mornings;Turning body quickly;Turning head quickly;Sit to stand  Relieving Factors Comments (goes away by itself in general)  Progression of Symptoms No change since onset  Oculomotor Exam  Oculomotor Alignment Normal  Ocular ROM WNL  Spontaneous Absent  Gaze-induced  Age appropriate nystagmus at end range  Head shaking Horizontal Absent  Head Shaking Vertical Absent  Smooth Pursuits Intact  Saccades Intact  Comment approximation test WNL for age  Vestibulo-Ocular Reflex  VOR 1 Head Only (x 1 viewing) WNL  VOR Cancellation Normal  Comment vor cancellation limited due to back pain  Positional Testing  Dix-Hallpike  (unable to test effectiely due to limited cervical ROM)  Horizontal Canal Testing  (unable to test effectively due to poor cervical ROM)  Orthostatics  BP supine (x 5 minutes) 126/72 (x3 minutes supine)  HR supine (x 5 minutes) 50  BP sitting 136/81  HR sitting 53  BP standing (after 1 minute) 123/73  HR standing (after 1 minute) 54  BP standing (after 3 minutes) 135/75  HR standing (after 3 minutes) 55

## 2023-07-21 ENCOUNTER — Encounter: Payer: Self-pay | Admitting: Physical Therapy

## 2023-07-21 ENCOUNTER — Ambulatory Visit: Payer: Medicare Other | Admitting: Physical Therapy

## 2023-07-21 DIAGNOSIS — R262 Difficulty in walking, not elsewhere classified: Secondary | ICD-10-CM | POA: Diagnosis not present

## 2023-07-21 DIAGNOSIS — M5459 Other low back pain: Secondary | ICD-10-CM | POA: Diagnosis not present

## 2023-07-21 DIAGNOSIS — M6281 Muscle weakness (generalized): Secondary | ICD-10-CM | POA: Diagnosis not present

## 2023-07-21 NOTE — Therapy (Signed)
 OUTPATIENT PHYSICAL THERAPY THORACOLUMBAR TREATMENT    Patient Name: Seth Bryant MRN: [id number] DOB:28-Feb-1946, 78 y.o., male Today's Date: 07/21/2023  END OF SESSION:  PT End of Session - 07/21/23 1259     Visit Number 3    Number of Visits 20    Date for PT Re-Evaluation 09/16/23    Authorization Type [employer]    Progress Note Due on Visit 10    PT Start Time 1259    PT Stop Time 1338    PT Time Calculation (min) 39 min    Activity Tolerance Patient tolerated treatment well    Behavior During Therapy WFL for tasks assessed/performed               Past Medical History:  Diagnosis Date   mini stroke    Past Surgical History:  Procedure Laterality Date   ANKLE SURGERY     BACK SURGERY     KNEE SURGERY     SHOULDER SURGERY     Patient Active Problem List   Diagnosis Date Noted   Encounter for well adult exam with abnormal findings 06/05/2023   Thyroid  nodule 06/05/2023   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   Anxiety 06/05/2023   B12 deficiency 03/31/2023   Physician orders for life-sustaining treatment (POLST) form indicates patient wish for do-not-resuscitate status 12/19/2022   Senile purpura (HCC) 12/19/2022   Thrombocytopenia (HCC) 12/19/2022   Driving safety issue 96/77/7975   Frequent falls 04/25/2022   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   Drug-induced photosensitivity 09/23/2019   Elevated PSA 03/04/2019   Itching 02/11/2019   Digital nerve laceration, finger 11/12/2017   Flexor tendon rupture of hand 11/12/2017   Transient neurological symptoms 06/09/2017   Chronic pain 09/27/2016   Diverticulosis of colon 10/09/2015   Internal hemorrhoid 10/09/2015   Forgetfulness 06/19/2015   Age-related nuclear cataract of both eyes 11/10/2014   Senile incipient cataract 11/10/2014   Alcoholism (HCC) 11/07/2014   Memory change  11/07/2014   Cataract of both eyes 08/01/2014   Neoplasm of uncertain behavior of skin 07/18/2014   Insomnia 04/01/2014   Lateral epicondylitis 06/14/2009   Fatigue 10/14/2008   Polymyalgia rheumatica (HCC) 06/01/2008   Benign neoplasm of large intestine 05/23/2008   Skin cancer 03/07/2008   Actinic keratosis 03/07/2008   Arthralgia of multiple sites 01/28/2008   Cervical spondylosis 11/26/2006   Neck pain 11/26/2006   Somatic dysfunction of cervical region 11/26/2006   Lumbar spondylosis 11/04/2005   Anesthesia of skin 09/19/2005    PCP: Norleen Lynwood ORN, MD   REFERRING PROVIDER: Trudy Duwaine BRAVO, NP   REFERRING DIAG:  Diagnosis  S39.012A (ICD-10-CM) - Strain of lumbar region, initial encounter  M54.16 (ICD-10-CM) - Lumbar radiculopathy  M47.816 (ICD-10-CM) - Facet arthropathy, lumbar  M48.061 (ICD-10-CM) - Spinal stenosis of lumbar region without neurogenic claudication    Rationale for Evaluation and Treatment: Rehabilitation  THERAPY DIAG:  Other low back pain  Muscle weakness (generalized)  Difficulty in walking, not elsewhere classified  ONSET DATE: Beginning of October 2024  SUBJECTIVE:  SUBJECTIVE STATEMENT:  I feel OK. I feel a lot better, I'm thinking seriously about making this my last visit because I'm not hurting anymore. I feel like climbing a rope I feel so good.      EVAL: Pt arriving for evaluation of acute on chronic right sided lower back pain, intermittent radiation of pain around to right groin. Some relief of pain with home exercise regimen. He does take Norco prescribed by his PCP. He was recently seen in the emergency department on 05/23/2023. There is also moderate spinal canal stenosis noted at L2-L3. No history of lumbar injections. History of lumbar surgery,  40 plus years ago. States he underwent surgery due to multiple spine fractures after jumping down from scaffolding at work. Patient seems to be fairly active, he enjoys playing tennis.  Pt also reporting when he gets up quickly he gets dizzy and this occurs intermittently.   PERTINENT HISTORY:  Ankle surgery, back surgery, knee surgery, shoulder surgery  PAIN:  NPRS scale: 0/10  PRECAUTIONS: None  WEIGHT BEARING RESTRICTIONS: No  FALLS:  Has patient fallen in last 6 months? No  LIVING ENVIRONMENT: Lives with: lives with their family Lives in: House/apartment Has following equipment at home: None  OCCUPATION: retired  PLOF: Independent  PATIENT GOALS: play tennis, work outside  Next MD Visit: no f/u currently made with referring provider, f/u with PCP May 2025   OBJECTIVE:   DIAGNOSTIC FINDINGS:  FINDINGS: Segmentation: 5 lumbar type vertebrae.   Alignment: Normal   Vertebrae: No acute fracture or focal pathologic process. Confluent osteophytes at T11-L2. Lucency through an L2 anterior inferior endplate osteophyte which is chronic appearing. No adjacent soft tissue edema noted.   Paraspinal and other soft tissues: No evidence of perispinal hematoma or masslike finding.   Disc levels: Generalized degenerative endplate and facet spurring. Degenerative foraminal narrowing especially on the right at L3-4 and below and on the left at L4-5, L5-S1. Ridging and ligamentous thickening causes moderate spinal stenosis at L2-3.    PATIENT SURVEYS:  07/08/23:  FOTO eval:  58% (predicted 65%)    SCREENING FOR RED FLAGS: Bowel or bladder incontinence: No Cauda equina syndrome: No  COGNITION: Overall cognitive status: WFL normal      SENSATION: WFL    POSTURE:  07/08/23: rounded shoulders and forward head  PALPATION: 07/08/23: TTP: Rt QL and lumbar paraspinals  LUMBAR ROM:    AROM 07/08/23  Flexion 55  Extension 8  Right lateral flexion 10  Left lateral  flexion 10  Right rotation Limited 25%  Left rotation Limited 25%   (Blank rows = not tested)  LOWER EXTREMITY ROM:       Right 07/08/23 supine Left 07/08/23 supine  Hip flexion 104 102  Hip extension    Hip abduction    Hip adduction    Hip internal rotation    Hip external rotation    Knee flexion 138 140  Knee extension    Ankle dorsiflexion    Ankle plantarflexion    Ankle inversion    Ankle eversion     (Blank rows = not tested)  LOWER EXTREMITY MMT:    MMT Right 07/08/23 Left 07/08/23  Hip flexion 4 4+  Hip extension    Hip abduction 5 5  Hip adduction 5 5  Hip internal rotation    Hip external rotation    Knee flexion 5 4+  Knee extension 5 5  Ankle dorsiflexion    Ankle plantarflexion    Ankle inversion  Ankle eversion     (Blank rows = not tested)  LUMBAR SPECIAL TESTS:  07/08/23: Slump test: Negative  FUNCTIONAL TESTS:  07/08/23: 5 times sit to stand: 8.9 seconds no UE support                                                                                                                                                                                                                     TODAY'S TREATMENT:                                                                                                         DATE:    07/21/23  TherEx  Nustep L6 x8 minutes all four extremities  Discussed lumbar extensions at wall Bridges x12  SKTC 6x5 seconds B  Lumbar rotation stretch 5x3 seconds B Piriformis figure 4 stretch 3x30 seconds B HS stretches 3x30 seconds with strap B         07/18/23  Worked up dizziness- vestibular testing and orthostatics     07/18/23 0001  Symptom Behavior  Subjective history of current problem see subjective section at beginning of note  Type of Dizziness  Imbalance;Spinning;Funny feeling in head;Lightheadedness  Frequency of Dizziness every night when I go to sleep/when I wake up but not constant   Duration of Dizziness starts off strong and gets slowly better, maybe within 30 minute I don't recognize it  Symptom Nature Spontaneous;Motion provoked  Aggravating Factors Mornings;Turning body quickly;Turning head quickly;Sit to stand  Relieving Factors Comments (goes away by itself in general)  Progression of Symptoms No change since onset  Oculomotor Exam  Oculomotor Alignment Normal  Ocular ROM WNL  Spontaneous Absent  Gaze-induced  Age appropriate nystagmus at end range  Head shaking Horizontal Absent  Head Shaking Vertical Absent  Smooth Pursuits Intact  Saccades Intact  Comment approximation test WNL for age  Vestibulo-Ocular Reflex  VOR 1 Head Only (x 1 viewing) WNL  VOR Cancellation Normal  Comment vor cancellation limited due to back pain  Positional Testing  Dix-Hallpike  (unable to  test effectiely due to limited cervical ROM)  Horizontal Canal Testing  (unable to test effectively due to poor cervical ROM)  Orthostatics  BP supine (x 5 minutes) 126/72 (x3 minutes supine)  HR supine (x 5 minutes) 50  BP sitting 136/81  HR sitting 53  BP standing (after 1 minute) 123/73  HR standing (after 1 minute) 54  BP standing (after 3 minutes) 135/75  HR standing (after 3 minutes) 55      07/08/23  Therex: HEP instruction/performance c cues for techniques, handout provided.  Trial set performed of each for comprehension and symptom assessment.  See below for exercise list  PATIENT EDUCATION:  Education details: HEP, POC Person educated: Patient Education method: Explanation, Demonstration, Verbal cues, and Handouts Education comprehension: verbalized understanding, returned demonstration, and verbal cues required  HOME EXERCISE PROGRAM: Access Code: [email or web address] URL: [email or web address] Date: 07/08/2023 Prepared by: Delon Lunger  Exercises - Supine Lower Trunk Rotation  - 2 x daily - 7 x weekly - 3 reps - 30 seconds hold - Supine Bridge  - 2  x daily - 7 x weekly - 2 sets - 10 reps - 5 se conds hold - Hooklying Single Knee to Chest Stretch  - 2 x daily - 7 x weekly - 3 reps - 10 seconds hold - Standing Lumbar Extension at Wall - Forearms  - 2 x daily - 7 x weekly - 5 reps - 10 seconds hold  ASSESSMENT:  CLINICAL IMPRESSION:   Pt arrives today doing OK, he reports he would like to make today his last visit as his pain is gone. Still very impulsive and easily distractible today, very talkative and required mod cues to stay on track. Focused more on functional exercises as able and tolerated this session. We will go on hold after today for 30 days, if his pain returns and he would like to return to PT before hold period is up that is fine.      EVAL: Patient is a 78 y.o. who comes to clinic with complaints of chronic right side low back pain with mobility, strength and movement coordination deficits that impair their ability to perform usual daily and recreational functional activities without increase difficulty/symptoms at this time. Pt also stating he wishes to return to playing tennis. Pt also reporting dizziness with standing quickly which would need to be assessed further at next visit.  Patient to benefit from skilled PT services to address impairments and limitations to improve to previous level of function without restriction secondary to condition.   OBJECTIVE IMPAIRMENTS: decreased activity tolerance, decreased mobility, decreased ROM, decreased strength, and pain.   ACTIVITY LIMITATIONS: lifting, bending, standing, squatting, and sleeping  PARTICIPATION LIMITATIONS: community activity, yard work, and recreation  PERSONAL FACTORS: 3+ comorbidities: see pertinent history  are also affecting patient's functional outcome.   REHAB POTENTIAL: Good  CLINICAL DECISION MAKING: Stable/uncomplicated  EVALUATION COMPLEXITY: Low   GOALS: Goals reviewed with patient? Yes  SHORT TERM GOALS: (target date for Short term goals  are 3 weeks 07/29/2023)  1. Patient will demonstrate independent use of home exercise program to maintain progress from in clinic treatments.  Goal status: New  LONG TERM GOALS: (target dates for all long term goals are 10 weeks  09/16/2023 )   1. Patient will demonstrate/report pain at worst less than or equal to 2/10 to facilitate minimal limitation in daily activity secondary to pain symptoms.  Goal status: New   2. Patient will demonstrate independent use of home exercise  program to facilitate ability to maintain/progress functional gains from skilled physical therapy services.  Goal status: New   3. Patient will demonstrate FOTO outcome > or = 65 % to indicate reduced disability due to condition.  Goal status: New   4. Patient will demonstrate lumbar extension >/= 15 degrees s symptoms to facilitate upright standing, walking posture at PLOF s limitation.  Goal status: New   5.  Pt will be able to demonstrate correct body mechanics when lifting 20# from floor to counter height with back pain </= 2/10.   Goal status: New   6.  Pt will be able to return to  Goal status: New     PLAN:  PT FREQUENCY: 1-2x/week  PT DURATION: 10 weeks  PLANNED INTERVENTIONS: Can include 02853- PT Re-evaluation, 97110-Therapeutic exercises, 97530- Therapeutic activity, [id number]- Neuromuscular re-education, 97535- Self Care, 97140- Manual therapy, [id number]- Gait training, [phone]- Orthotic Fit/training, [phone]- Canalith repositioning, [id number]- Aquatic Therapy, 97014- Electrical stimulation (unattended), [id number]- Electrical stimulation (manual), [id number]- Vasopneumatic device, [id number]- Ultrasound, [id number]- Traction (mechanical), [id number]- Ionotophoresis 4mg /ml Dexamethasone, Patient/Family education, Balance training, Stair training, Taping, Dry Needling, Joint mobilization, Joint manipulation, Spinal manipulation, Spinal mobilization, Scar mobilization, Vestibular training, Visual/preceptual remediation/compensation, DME  instructions, Cryotherapy, and Moist heat.  All performed as medically necessary.  All included unless contraindicated  PLAN FOR NEXT SESSION:  on hold after today (30 days, until 08/20/23) and DC if we do not hear from him in that time period        Josette Rough, [place], DPT 07/21/23 1:38 PM   Date of referral: 06/12/23 Referring provider: Trudy Duwaine BRAVO, NP   Referring diagnosis?  Diagnosis  S39.012A (ICD-10-CM) - Strain of lumbar region, initial encounter  M54.16 (ICD-10-CM) - Lumbar radiculopathy  M47.816 (ICD-10-CM) - Facet arthropathy, lumbar  M48.061 (ICD-10-CM) - Spinal stenosis of lumbar region without neurogenic claudication   Treatment diagnosis? (if different than referring diagnosis) M54.59, M62.81, R26.2  What was this (referring dx) caused by? Ongoing Issue  [place]le of Condition: Initial Onset (within last 3 months)   Laterality: Rt  Current Functional Measure Score: FOTO 58%  Objective measurements identify impairments when they are compared to normal values, the uninvolved extremity, and prior level of function.  [x]  Yes  []  No  Objective assessment of functional ability: Moderate functional limitations   Briefly describe symptoms: Rt sided low back pain which radiates into Rt hip and thigh at times. Pt also experiencing dizziness with initial standing with LOB posteriorly.  How did symptoms start: After yard work at beginning of October 2024  Average pain intensity:  Last 24 hours: 4/10  Past week: 5-6/10 depending on activity  How often does the pt experience symptoms? Frequently  How much have the symptoms interfered with usual daily activities? Moderately  How has condition changed since care began at this facility? NA - initial visit  In general, how is the patients overall health? Good   BACK PAIN (STarT Back Screening Tool) Has pain spread down the leg(s) at some time in the last 2 weeks? yes Has there been pain in the shoulder or neck  at some time in the last 2 weeks? Pt stating when he turns his head pain extends down his entire spine Has the pt only walked short distances because of back pain? At times Has patient dressed more slowly because of back pain in the past 2 weeks? no Has patient stopped enjoying things they usually enjoy? Yes, pt can't play tennis and perform certain household chores Overall,  how bothersome has back pain been in the last 2 weeks?                    Very Much

## 2023-07-25 ENCOUNTER — Ambulatory Visit: Payer: Medicare Other | Attending: Physician Assistant | Admitting: Physical Therapy

## 2023-07-25 ENCOUNTER — Encounter: Payer: Self-pay | Admitting: Physical Therapy

## 2023-07-25 ENCOUNTER — Other Ambulatory Visit: Payer: Self-pay

## 2023-07-25 DIAGNOSIS — R42 Dizziness and giddiness: Secondary | ICD-10-CM | POA: Diagnosis not present

## 2023-07-25 DIAGNOSIS — H8110 Benign paroxysmal vertigo, unspecified ear: Secondary | ICD-10-CM | POA: Insufficient documentation
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
## 2023-08-01 ENCOUNTER — Ambulatory Visit (INDEPENDENT_AMBULATORY_CARE_PROVIDER_SITE_OTHER): Payer: Medicare Other

## 2023-08-01 VITALS — BP 110/70 | HR 54 | Ht 65.0 in | Wt 147.6 lb

## 2023-08-01 DIAGNOSIS — Z0000 Encounter for general adult medical examination without abnormal findings: Secondary | ICD-10-CM

## 2023-08-01 NOTE — Patient Instructions (Addendum)
Seth Bryant , Thank you for taking time to come for your Medicare Wellness Visit. I appreciate your ongoing commitment to your health goals. Please review the following plan we discussed and let me know if I can assist you in the future.   Referrals/Orders/Follow-Ups/Clinician Recommendations: It was nice to meet you today.  Please start taking an Aspirin 81 mg daily-per Dr. Jonny Ruiz.  Remember to discuss a colonoscopy or and a cologuard with Dr. Jonny Ruiz.  Aim for 30 minutes of exercise or brisk walking, 6-8 glasses of water, and 5 servings of fruits and vegetables each day.    This is a list of the screening recommended for you and due dates:  Health Maintenance  Topic Date Due   Medicare Annual Wellness Visit  Never done   Hepatitis C Screening  Never done   Zoster (Shingles) Vaccine (1 of 2) 10/05/1963   COVID-19 Vaccine (4 - 2024-25 season) 03/09/2023   Flu Shot  10/06/2023*   DTaP/Tdap/Td vaccine (3 - Td or Tdap) 11/06/2027   Pneumonia Vaccine  Completed   HPV Vaccine  Aged Out  *Topic was postponed. The date shown is not the original due date.    Advanced directives: (Copy Requested) Please bring a copy of your health care power of attorney and living will to the office to be added to your chart at your convenience.  Next Medicare Annual Wellness Visit scheduled for next year: Yes

## 2023-08-01 NOTE — Progress Notes (Signed)
Subjective:   Seth Bryant is a 78 y.o. male who presents for Medicare Annual/Subsequent preventive examination.  Visit Complete: In person   Cardiac Risk Factors include: advanced age (>28men, >30 women);male gender;Other (see comment), Risk factor comments: Adenocarcinoma of prostate     Objective:    Today's Vitals   08/01/23 1428  BP: 110/70  Pulse: (!) 54  SpO2: 99%  Weight: 147 lb 9.6 oz (67 kg)  Height: 5\' 5"  (1.651 m)  PainSc: 3    Body mass index is 24.56 kg/m.     08/01/2023    3:39 PM 07/25/2023    2:01 PM 07/17/2023    1:15 PM 07/08/2023    4:10 PM 05/23/2023    7:01 AM 04/19/2023   11:59 AM 02/03/2018    9:47 AM  Advanced Directives  Does Patient Have a Medical Advance Directive? Yes No No No No No No  Type of Estate agent of [place];Living will        Copy of Healthcare Power of Attorney in Chart? No - copy requested        Would patient like information on creating a medical advance directive?  No - Patient declined No - Patient declined No - Patient declined No - Patient declined  No - Patient declined    Current Medications (verified) Outpatient Encounter Medications as of 08/01/2023  Medication Sig   ALPRAZolam (XANAX) 1 MG tablet Take 1 tablet (1 mg total) by mouth 2 (two) times daily as needed for anxiety.   aspirin EC 81 MG tablet Take 1 tablet (81 mg total) by mouth daily. Swallow whole.   fluticasone (FLONASE) 50 MCG/ACT nasal spray Place 2 sprays into both nostrils daily.   HYDROcodone-acetaminophen (NORCO/VICODIN) 5-325 MG tablet Take 1 tablet by mouth every 6 (six) hours as needed.   memantine (NAMENDA) 5 MG tablet Take 1 tablet (5 mg at night) for 2 weeks, then increase to 1 tablet (5 mg) twice a day   amoxicillin (AMOXIL) 500 MG capsule Take 500 mg by mouth 3 (three) times daily. (Patient not taking: Reported on 08/01/2023)   methocarbamol (ROBAXIN) 500 MG tablet Take 1 tablet (500 mg total) by mouth 2 (two) times daily.  (Patient not taking: Reported on 08/01/2023)   methocarbamol (ROBAXIN) 500 MG tablet Take 1 tablet (500 mg total) by mouth 2 (two) times daily as needed for muscle spasms. (Patient not taking: Reported on 08/01/2023)   naproxen (NAPROSYN) 375 MG tablet Take 1 tablet (375 mg total) by mouth 2 (two) times daily. (Patient not taking: Reported on 08/01/2023)   oxyCODONE-acetaminophen (PERCOCET/ROXICET) 5-325 MG tablet Take 1 tablet by mouth every 6 (six) hours as needed for severe pain (pain score 7-10). (Patient not taking: Reported on 08/01/2023)   No facility-administered encounter medications on file as of 08/01/2023.    Allergies (verified) Latex   History: Past Medical History:  Diagnosis Date   mini stroke    Past Surgical History:  Procedure Laterality Date   ANKLE SURGERY     BACK SURGERY     KNEE SURGERY     SHOULDER SURGERY     Family History  Problem Relation Age of Onset   Heart failure Father    Social History   Socioeconomic History   Marital status: Legally Separated    Spouse name: Not on file   Number of children: 2   Years of education: 78   Highest education level: Not on file  Occupational History  Occupation: Retired  Tobacco Use   Smoking status: Never   Smokeless tobacco: Never  Vaping Use   Vaping status: Never Used  Substance and Sexual Activity   Alcohol use: Yes   Drug use: Never   Sexual activity: Not on file  Other Topics Concern   Not on file  Social History Narrative   Moved here from [place]   Trying to find a place to leave   Going to live with his mom   Retired   Left handed   Social Drivers of Corporate investment banker Strain: Low Risk  (08/01/2023)   Overall Financial Resource Strain (CARDIA)    Difficulty of Paying Living Expenses: Not hard at all  Food Insecurity: No Food Insecurity (08/01/2023)   Hunger Vital Sign    Worried About Running Out of Food in the Last Year: Never true    Ran Out of Food in the Last Year:  Never true  Transportation Needs: Not on file  Physical Activity: Inactive (08/01/2023)   Exercise Vital Sign    Days of Exercise per Week: 0 days    Minutes of Exercise per Session: 0 min  Stress: No Stress Concern Present (08/01/2023)   [employer] of Occupational Health - Occupational Stress Questionnaire    Feeling of Stress : Not at all  Social Connections: Moderately Isolated (08/01/2023)   Social Connection and Isolation Panel [NHANES]    Frequency of Communication with Friends and Family: More than three times a week    Frequency of Social Gatherings with Friends and Family: More than three times a week    Attends Religious Services: More than 4 times per year    Active Member of [employer] or Organizations: No    Attends Banker Meetings: Never    Marital Status: Separated    Tobacco Counseling Counseling given: Not Answered   Clinical Intake:  Pre-visit preparation completed: Yes  Pain : 0-10 Pain Score: 3  Pain Type: Chronic pain Pain Location: Back Pain Orientation: Lower Pain Descriptors / Indicators: Aching, Discomfort Pain Onset: More than a month ago Pain Frequency: Constant     BMI - recorded: 24.56 Nutritional Status: BMI 25 -29 Overweight Nutritional Risks: None Diabetes: No  How often do you need to have someone help you when you read instructions, pamphlets, or other written materials from your doctor or pharmacy?: 1 - Never     Information entered by :: Jadyn Barge, RMA   Activities of Daily Living    08/01/2023   12:43 PM  In your present state of health, do you have any difficulty performing the following activities:  Hearing? 0  Vision? 0  Difficulty concentrating or making decisions? 0  Walking or climbing stairs? 0  Dressing or bathing? 0  Doing errands, shopping? 0  Comment mom drives  Preparing Food and eating ? N  Using the Toilet? N  In the past six months, have you accidently leaked urine? N  Do you have  problems with loss of bowel control? N  Managing your Medications? N  Managing your Finances? N  Housekeeping or managing your Housekeeping? N    Patient Care Team: Corwin Levins, MD as PCP - General (Internal Medicine)  Indicate any recent Medical Services you may have received from other than [facility] providers in the past year (date may be approximate).     Assessment:   This is a routine wellness examination for Seth Bryant.  Hearing/Vision screen Hearing Screening - Comments:: Denies  hearing difficulties   Vision Screening - Comments:: Age-related nuclear cataract of both eyes   Goals Addressed   None   Depression Screen    08/01/2023    2:42 PM 06/03/2023    2:15 PM  PHQ 2/9 Scores  PHQ - 2 Score 3 0  PHQ- 9 Score 4     Fall Risk    08/01/2023    3:39 PM 07/17/2023    1:14 PM 06/03/2023    2:15 PM  Fall Risk   Falls in the past year? 0 1 0  Number falls in past yr: 0 1 0  Injury with Fall? 0 0 0  Risk for fall due to : No Fall Risks  No Fall Risks  Follow up Falls prevention discussed;Falls evaluation completed Falls evaluation completed Falls evaluation completed    MEDICARE RISK AT HOME: Medicare Risk at Home Any stairs in or around the home?: Yes (backsteps) If so, are there any without handrails?: Yes Home free of loose throw rugs in walkways, pet beds, electrical cords, etc?: Yes Adequate lighting in your home to reduce risk of falls?: Yes Life alert?: No Use of a cane, walker or w/c?: No Grab bars in the bathroom?: No Shower chair or bench in shower?: No Elevated toilet seat or a handicapped toilet?: No  TIMED UP AND GO:  Was the test performed?  Yes  Length of time to ambulate 10 feet: 15 sec Gait steady and fast without use of assistive device    Cognitive Function:      07/17/2023    1:00 PM  Montreal Cognitive Assessment   Visuospatial/ Executive (0/5) 1  Naming (0/3) 3  Attention: Read list of digits (0/2) 2  Attention: Read list of letters  (0/1) 1  Attention: Serial 7 subtraction starting at 100 (0/3) 2  Language: Repeat phrase (0/2) 0  Language : Fluency (0/1) 1  Abstraction (0/2) 1  Delayed Recall (0/5) 1  Orientation (0/6) 3  Total 15  Adjusted Score (based on education) 15      08/01/2023   12:44 PM  6CIT Screen  What Year? 0 points  What month? 0 points  What time? 0 points  Count back from 20 0 points  Months in reverse 0 points  Repeat phrase 0 points  Total Score 0 points    Immunizations Immunization History  Administered Date(s) Administered   Fluad Quad(high Dose 65+) 04/25/2022   Influenza, High Dose Seasonal PF 07/14/2017, 03/11/2019, 05/03/2020   PFIZER(Purple Top)SARS-COV-2 Vaccination 09/13/2019, 10/04/2019, 05/03/2020   Pneumococcal Conjugate-13 01/15/2016   Pneumococcal Polysaccharide-23 11/27/2012, 03/31/2017   Tdap 11/27/2012, 11/05/2017   Zoster, Live 04/01/2014    TDAP status: Up to date  Flu Vaccine status: Due, Education has been provided regarding the importance of this vaccine. Advised may receive this vaccine at local pharmacy or Health Dept. Aware to provide a copy of the vaccination record if obtained from local pharmacy or Health Dept. Verbalized acceptance and understanding.  Pneumococcal vaccine status: Up to date  Covid-19 vaccine status: Information provided on how to obtain vaccines.   Qualifies for Shingles Vaccine? Yes   Zostavax completed Yes   Shingrix Completed?: No.    Education has been provided regarding the importance of this vaccine. Patient has been advised to call insurance company to determine out of pocket expense if they have not yet received this vaccine. Advised may also receive vaccine at local pharmacy or Health Dept. Verbalized acceptance and understanding.  Screening Tests Health Maintenance  Topic Date Due   Hepatitis C Screening  Never done   Zoster Vaccines- Shingrix (1 of 2) 10/05/1963   COVID-19 Vaccine (4 - 2024-25 season) 03/09/2023    INFLUENZA VACCINE  10/06/2023 (Originally 02/06/2023)   Medicare Annual Wellness (AWV)  07/31/2024   DTaP/Tdap/Td (3 - Td or Tdap) 11/06/2027   Pneumonia Vaccine 80+ Years old  Completed   HPV VACCINES  Aged Out    Health Maintenance  Health Maintenance Due  Topic Date Due   Hepatitis C Screening  Never done   Zoster Vaccines- Shingrix (1 of 2) 10/05/1963   COVID-19 Vaccine (4 - 2024-25 season) 03/09/2023    Lung Cancer Screening: (Low Dose CT Chest recommended if Age 26-80 years, 20 pack-year currently smoking OR have quit w/in 15years.) does not qualify.   Lung Cancer Screening Referral: N/A  Additional Screening:  Hepatitis C Screening: does qualify;  Vision Screening: Recommended annual ophthalmology exams for early detection of glaucoma and other disorders of the eye. Is the patient up to date with their annual eye exam?  No  Who is the provider or what is the name of the office in which the patient attends annual eye exams? Need a referral If pt is not established with a provider, would they like to be referred to a provider to establish care? Yes .   Dental Screening: Recommended annual dental exams for proper oral hygiene   Community Resource Referral / Chronic Care Management: CRR required this visit?  No   CCM required this visit?  No     Plan:     I have personally reviewed and noted the following in the patient's chart:   Medical and social history Use of alcohol, tobacco or illicit drugs  Current medications and supplements including opioid prescriptions. Patient is not currently taking opioid prescriptions. Functional ability and status Nutritional status Physical activity Advanced directives List of other physicians Hospitalizations, surgeries, and ER visits in previous 12 months Vitals Screenings to include cognitive, depression, and falls Referrals and appointments  In addition, I have reviewed and discussed with patient certain preventive  protocols, quality metrics, and best practice recommendations. A written personalized care plan for preventive services as well as general preventive health recommendations were provided to patient.     Aboubacar Matsuo L Jenniah Bhavsar, CMA   08/01/2023   After Visit Summary: (Mail) Due to this being a telephonic visit, the after visit summary with patients personalized plan was offered to patient via mail   Nurse Notes: Patient is due for a Hep C screening.  He is also due for a colonoscopy.  Patient would like to discuss with Dr. Jonny Ruiz during his up coming office visit.  Patient wanted to know if he could do a cologuard.  He had no other concerns to address today.

## 2023-08-14 ENCOUNTER — Ambulatory Visit: Payer: Medicare Other

## 2023-08-14 ENCOUNTER — Encounter: Payer: Self-pay | Admitting: Psychology

## 2023-08-14 ENCOUNTER — Ambulatory Visit: Payer: Medicare Other | Admitting: Psychology

## 2023-08-14 DIAGNOSIS — F0150 Vascular dementia without behavioral disturbance: Secondary | ICD-10-CM | POA: Diagnosis not present

## 2023-08-14 DIAGNOSIS — F0280 Dementia in other diseases classified elsewhere without behavioral disturbance: Secondary | ICD-10-CM | POA: Insufficient documentation

## 2023-08-14 DIAGNOSIS — I6381 Other cerebral infarction due to occlusion or stenosis of small artery: Secondary | ICD-10-CM | POA: Insufficient documentation

## 2023-08-14 DIAGNOSIS — G309 Alzheimer's disease, unspecified: Secondary | ICD-10-CM

## 2023-08-14 DIAGNOSIS — F1020 Alcohol dependence, uncomplicated: Secondary | ICD-10-CM | POA: Diagnosis not present

## 2023-08-14 DIAGNOSIS — R4189 Other symptoms and signs involving cognitive functions and awareness: Secondary | ICD-10-CM

## 2023-08-14 HISTORY — DX: Vascular dementia, unspecified severity, without behavioral disturbance, psychotic disturbance, mood disturbance, and anxiety: F01.50

## 2023-08-14 NOTE — Progress Notes (Signed)
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 forgot.SABRASABRAGotcha! I came here to meet you Ms. Luke... I guess to find out if I'm crazy.  Intellectual abilities based upon educational and vocational attainment were estimated to be in the average to above average range. Premorbid abilities were estimated to be within the below average range based upon a single-word reading test.   Processing speed was average outside of a well below average performance across a rapid decoding task (RBANS Coding). Basic attention was well above average to exceptionally high. More complex attention (e.g., working memory) was below average to average. Executive functioning was variable, ranging from the exceptionally low to average normative ranges. He performed in the below average range across a task assessing safety and judgment. Points were largely lost due to significant tangential responding, likely causing him to lose his train of thought and ultimately answer in a manner unrelated to the question being asked.  Assessed receptive language abilities were well below average. He had notable trouble remembering and following  multi-step commands across this task. Mr. Hommel did not appear to exhibit prominent difficulties comprehending task instructions and answered all questions asked of him appropriately. Assessed expressive language was variable. Phonemic fluency was below average to average, semantic fluency was exceptionally low to below average, and confrontation naming was average across a screening task but well below average across a more comprehensive task.     Assessed visuospatial/visuoconstructional abilities were average to above average outside of his drawing of a clock. Points were lost on his drawing of a clock due to him placing only a single hand pointing to an incorrect location.    Learning (i.e., encoding) of novel verbal information was exceptionally low to well below average. Spontaneous delayed recall (i.e., retrieval) of previously learned information was average across a figure task but exceptionally low across list and story-based tasks. Retention rates were 0% across a list learning task, 25% (raw score of 1) across a story learning task, and 74% across a figure drawing task. Performance across recognition tasks was average across a figure task but below expectation across both verbal tasks, suggesting limited evidence for information consolidation.   Results of emotional screening instruments suggested that recent symptoms of generalized anxiety were in the minimal to mild range, while symptoms of depression were within normal limits. A screening instrument assessing recent sleep quality suggested the presence of minimal sleep dysfunction.  Tables of Scores:   Note: This summary of test scores accompanies the interpretive report and should not be considered in isolation without reference to the appropriate sections in the text. Descriptors are based on appropriate normative data and may be adjusted based on clinical judgment. Terms such as Within Normal Limits and  Outside Normal Limits are used  when a more specific description of the test score cannot be determined.       Percentile - Normative Descriptor > 98 - Exceptionally High 91-97 - Well Above Average 75-90 - Above Average 25-74 - Average 9-24 - Below Average 2-8 - Well Below Average < 2 - Exceptionally Low       Validity:   DESCRIPTOR       DCT: --- --- Within Normal Limits  RBANS EI: --- --- Within Normal Limits  WAIS-IV RDS: --- --- Within Normal Limits       Orientation:      Raw Score Percentile   NAB Orientation, Form 1 27/29 --- ---       Cognitive Screening:      Raw Score Percentile   SLUMS: 17/30 --- ---       RBANS, Form A: Standard Score/ Scaled Score Percentile   Total Score 80 9 Below Average  Immediate Memory 61 <1 Exceptionally Low    List Learning 4 2 Well Below Average    Story Memory 3 1 Exceptionally Low  Visuospatial/Constructional 112 79 Above Average    Figure Copy 12 75 Above Average    Line Orientation 18/20 51-75 Average  Language 78 7 Well Below Average    Picture Naming 10/10 51-75 Average    Semantic Fluency 2 <1 Exceptionally Low  Attention 103 58 Average    Digit Span 16 98 Exceptionally High    Coding 5 5 Well Below Average  Delayed Memory 68 2 Well Below Average    List Recall 0/10 <2 Exceptionally Low    List Recognition 15/20 <2 Exceptionally Low    Story Recall 2 <1 Exceptionally Low    Story Recognition 8/12 8-15 Well Below Average  to Below Average    Figure Recall 11 63 Average    Figure Recognition 7/8 53-69 Average       Intellectual Functioning:      Standard Score Percentile   Test of Premorbid Functioning: 88 21 Below Average       Attention/Executive Function:     Trail Making Test (TMT): Raw Score (T Score) Percentile     Part A 29 secs.,  0 errors (52) 58 Average    Part B Discontinued --- Impaired         Scaled Score Percentile   WAIS-IV Digit Span: 10 50 Average    Forward 15 95 Well Above Average    Backward 8 25 Average    Sequencing 6  9 Below Average        Scaled Score Percentile   WAIS-IV Similarities: 5 5 Well Below Average       D-KEFS Color-Word Interference Test: Raw Score (Scaled Score) Percentile     Color Naming 31 secs. (11) 63 Average    Word Reading 25 secs. (10) 50 Average    Inhibition 84 secs. (8) 25 Average      Total Errors 4 errors (9) 37 Average    Inhibition/Switching Discontinued --- Impaired      Total Errors --- --- ---       D-KEFS Verbal Fluency Test: Raw Score (Scaled Score) Percentile     Letter Total Correct 30 (9) 37 Average    Category Total Correct 26 (7) 16 Below Average    Category Switching Total Correct 6 (3) 1 Exceptionally Low    Category Switching Accuracy 3 (2) <1 Exceptionally Low      Total Set Loss  Errors 14 (1) <1 Exceptionally Low      Total Repetition Errors 2 (11) 63 Average       NAB Executive Functions Module, Form 1: T Score Percentile     Judgment 40 16 Below Average       Language:     Verbal Fluency Test: Raw Score (T Score) Percentile     Phonemic Fluency (FAS) 30 (40) 16 Below Average    Animal Fluency 10 (25) 1 Exceptionally Low        NAB Language Module, Form 1: T Score Percentile     Auditory Comprehension 29 2 Well Below Average    Naming 26/31 (29) 2 Well Below Average       Visuospatial/Visuoconstruction:      Raw Score Percentile   Clock Drawing: 7/10 --- Within Normal Limits        Scaled Score Percentile   WAIS-IV Block Design: 8 25 Average       Mood and Personality:      Raw Score Percentile   Geriatric Depression Scale: 1 --- Within Normal Limits  Geriatric Anxiety Scale: 11 --- Minimal    Somatic 6 --- Mild    Cognitive 3 --- Mild    Affective 2 --- Minimal       Additional Questionnaires:      Raw Score Percentile   PROMIS Sleep Disturbance Questionnaire: 16 --- None to Slight   Informed Consent and Coding/Compliance:   The current evaluation represents a clinical evaluation for the purposes previously outlined by the  referral source and is in no way reflective of a forensic evaluation.   Mr. Keast was provided with a verbal description of the nature and purpose of the present neuropsychological evaluation. Also reviewed were the foreseeable risks and/or discomforts and benefits of the procedure, limits of confidentiality, and mandatory reporting requirements of this provider. The patient was given the opportunity to ask questions and receive answers about the evaluation. Oral consent to participate was provided by the patient.   This evaluation was conducted by Arthea KYM Maryland, Ph.D., ABPP-CN, board certified clinical neuropsychologist. Mr. Mcglothen completed a clinical interview with Dr. Maryland, billed as one unit [phone], and 120 minutes of cognitive testing and scoring, billed as one unit [phone] and three additional units [phone]. Psychometrist Luke Pitcher, B.S. assisted Dr. Maryland with test administration and scoring procedures. As a separate and discrete service, one unit [id number] and two units [phone] were billed for Dr. Loralee time spent in interpretation and report writing.

## 2023-08-14 NOTE — Progress Notes (Signed)
   Psychometrician Note   Cognitive testing was administered to Norleen Sharps by Luke Pitcher, B.S. (psychometrist) under the supervision of Dr. Zachary C. Merz, Ph.D., ABPP, licensed psychologist on 08/14/2023. Mr. Wahlen did not appear overtly distressed by the testing session per behavioral observation or responses across self-report questionnaires. Rest breaks were offered.    The battery of tests administered was selected by Dr. Arthea KYM Maryland, Ph.D., ABPP with consideration to Mr. Pitcock current level of functioning, the nature of his symptoms, emotional and behavioral responses during interview, level of literacy, observed level of motivation/effort, and the nature of the referral question. This battery was communicated to the psychometrist. Communication between Dr. Arthea KYM Maryland, Ph.D., ABPP and the psychometrist was ongoing throughout the evaluation and Dr. Arthea KYM Maryland, Ph.D., ABPP was immediately accessible at all times. Dr. Zachary C. Merz, Ph.D., ABPP provided supervision to the psychometrist on the date of this service to the extent necessary to assure the quality of all services provided.    Shiloh Swopes will return within approximately 1-2 weeks for an interactive feedback session with Dr. Maryland at which time his test performances, clinical impressions, and treatment recommendations will be reviewed in detail. Mr. Rallis understands he can contact our office should he require our assistance before this time.  A total of 120 minutes of billable time were spent face-to-face with Mr. Shorten by the psychometrist. This includes both test administration and scoring time. Billing for these services is reflected in the clinical report generated by Dr. Arthea KYM Maryland, Ph.D., ABPP  This note reflects time spent with the psychometrician and does not include test scores or any clinical interpretations made by Dr. Maryland. The full report will follow in a separate note.

## 2023-08-21 ENCOUNTER — Ambulatory Visit: Payer: Medicare Other | Admitting: Psychology

## 2023-08-21 DIAGNOSIS — F0150 Vascular dementia without behavioral disturbance: Secondary | ICD-10-CM

## 2023-08-21 DIAGNOSIS — F1020 Alcohol dependence, uncomplicated: Secondary | ICD-10-CM | POA: Diagnosis not present

## 2023-08-21 DIAGNOSIS — G309 Alzheimer's disease, unspecified: Secondary | ICD-10-CM | POA: Diagnosis not present

## 2023-08-21 DIAGNOSIS — I6381 Other cerebral infarction due to occlusion or stenosis of small artery: Secondary | ICD-10-CM

## 2023-08-21 DIAGNOSIS — F0280 Dementia in other diseases classified elsewhere without behavioral disturbance: Secondary | ICD-10-CM

## 2023-08-21 NOTE — Progress Notes (Signed)
   Neuropsychology Feedback Session Seth Bryant. [facility] Department of Neurology  Reason for Referral:   Seth Bryant is a 78 y.o. mixed-handed (largely left-handed but right-handed for athletic participation) Caucasian male referred by Marlowe Kays, PA-C, to characterize his current cognitive functioning and assist with diagnostic clarity and treatment planning in the context of subjective cognitive decline.   Feedback:   [name] completed a comprehensive neuropsychological evaluation on 08/14/2023. Please refer to that encounter for the full report and recommendations. Briefly, results suggested significant impairment surrounding semantic fluency and all aspects of verbal learning and memory. Additional weaknesses were exhibited across executive functioning and receptive language, while variability was exhibited across processing speed and confrontation naming. 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 I do have noteworthy concern for an underlying neurodegenerative illness, namely Alzheimer's disease. [name] did not benefit from repeated exposure across learning trials, exhibited very poor retention rates across verbal measures, and performed poorly across yes/no recognition trials. Taken together, this raises concern for rapid forgetting and an evolving and already pronounced storage impairment. Additional impairment/weakness surrounding semantic fluency, confrontation naming, and executive functioning would follow a fairly classic progression. Visual memory remained intact which is encouraging and could suggest this illness being in somewhat earlier stages if truly present. This should certainly remain on his differential. 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 Given chronically elevated consumption rates, this also remains a plausible. Testing wise, [name] exhibited normal performances  across attention/concentration and visuospatial abilities, which would be atypical for a classic alcohol dementia presentation. 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 However, this remains somewhat speculative at the present time.   [name] was accompanied by his mother during the current feedback session. Content of the current session focused on the results of his neuropsychological evaluation. [name] was given the opportunity to ask questions and his questions were answered. He was encouraged to reach out should additional questions arise. A copy of his report was provided at the conclusion of the visit.      One unit [phone] was billed for Dr. Tammy Sours time spent preparing for, conducting, and documenting the current feedback session with [name].

## 2023-08-22 ENCOUNTER — Telehealth: Payer: Self-pay | Admitting: Psychology

## 2023-08-22 NOTE — Telephone Encounter (Signed)
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

## 2023-11-06 ENCOUNTER — Telehealth: Payer: Self-pay

## 2023-11-06 ENCOUNTER — Telehealth: Payer: Self-pay | Admitting: Internal Medicine

## 2023-11-06 MED ORDER — SILDENAFIL CITRATE 100 MG PO TABS
50.0000 mg | ORAL_TABLET | Freq: Every day | ORAL | 11 refills | Status: DC | PRN
Start: 2023-11-06 — End: 2023-11-11

## 2023-11-06 NOTE — Addendum Note (Signed)
 Addended by: Roslyn Coombe on: 11/06/2023 04:36 PM   Modules accepted: Orders

## 2023-11-06 NOTE — Telephone Encounter (Signed)
 Copied from CRM [id number]. Topic: Clinical - Medication Question >> Nov 06, 2023  2:40 PM Concetta Dee E wrote: Reason for CRM: Pt would like to fill a prescription for viagra 

## 2023-11-06 NOTE — Telephone Encounter (Unsigned)
 Copied from CRM [id number]. Topic: Clinical - Medication Question >> Nov 06, 2023  2:40 PM Concetta Dee E wrote: Reason for CRM: Pt would like to fill a prescription for viagra 

## 2023-11-06 NOTE — Telephone Encounter (Signed)
 Ok done erx

## 2023-11-11 ENCOUNTER — Other Ambulatory Visit: Payer: Self-pay | Admitting: Internal Medicine

## 2023-11-11 MED ORDER — SILDENAFIL CITRATE 100 MG PO TABS: 50.0000 mg | ORAL_TABLET | Freq: Every day | ORAL | 11 refills | Status: AC | PRN

## 2023-11-11 NOTE — Telephone Encounter (Signed)
 Copied from CRM [id number]. Topic: Clinical - Medication Refill >> Nov 11, 2023  1:35 PM Caliyah H wrote: Most Recent Primary Care Visit:  Provider: Gary Kapur  Department: [facility]  Visit Type: MEDICARE AWV, SEQUENTIAL  Date: 08/01/2023  Medication: sildenafil  (VIAGRA )  Has the patient contacted their pharmacy? No (Agent: If no, request that the patient contact the pharmacy for the refill. If patient does not wish to contact the pharmacy document the reason why and proceed with request.) (Agent: If yes, when and what did the pharmacy advise?)  Is this the correct pharmacy for this prescription? Yes If no, delete pharmacy and type the correct one.  This is the patient's preferred pharmacy:  [id number] [place], [place] - [address] [place] [address] Phone: [phone] Fax: [phone]   Has the prescription been filled recently? No  Is the patient out of the medication? Yes  Has the patient been seen for an appointment in the last year OR does the patient have an upcoming appointment? Yes  Can we respond through MyChart? No  Agent: Please be advised that Rx refills may take up to 3 business days. We ask that you follow-up with your pharmacy.

## 2023-11-11 NOTE — Telephone Encounter (Signed)
 Pt called and informed that Viagra  [facility] to [facility] on [facility]. RN attempted to call pharm however, they were closed.

## 2023-11-12 ENCOUNTER — Other Ambulatory Visit: Payer: Self-pay | Admitting: Internal Medicine

## 2023-11-14 ENCOUNTER — Encounter: Payer: Self-pay | Admitting: Physician Assistant

## 2023-11-14 ENCOUNTER — Ambulatory Visit (INDEPENDENT_AMBULATORY_CARE_PROVIDER_SITE_OTHER): Payer: Medicare Other | Admitting: Physician Assistant

## 2023-11-14 VITALS — BP 126/71 | Resp 18 | Wt 144.0 lb

## 2023-11-14 DIAGNOSIS — F0150 Vascular dementia without behavioral disturbance: Secondary | ICD-10-CM

## 2023-11-14 DIAGNOSIS — F0280 Dementia in other diseases classified elsewhere without behavioral disturbance: Secondary | ICD-10-CM

## 2023-11-14 DIAGNOSIS — G309 Alzheimer's disease, unspecified: Secondary | ICD-10-CM | POA: Diagnosis not present

## 2023-11-14 MED ORDER — MEMANTINE HCL 10 MG PO TABS: ORAL_TABLET | ORAL | 3 refills | Status: DC

## 2023-11-14 MED ORDER — DONEPEZIL HCL 10 MG PO TABS: ORAL_TABLET | ORAL | 3 refills | Status: DC

## 2023-11-14 NOTE — Progress Notes (Signed)
 Assessment/Plan:    Dementia likely due to Alzheimer's disease   Seth Bryant is a very pleasant 78 y.o. 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 Patient is currently on memantine  5 mg bid. Memory appears worse than prior, as well as comprehension. Unfortunately, his mother is not only hard of hearing but demonstrates comprehension issues as well, unable to tell if the plan is understood.  He continues to consume muscle relaxers, pain medicine, xanax   in moderate quantities, recommend again for him to have this managed closely     Follow up in 6  months. Increase memantine  to 10 mg  twice a day, side effects discussed  Replenish B12 Monitor the use of pain medications and muscle relaxers, which can contribute to memory decline Recommend discontinuing Xanax , recommend psychotherapy for situational stress Continue PT with orthopedics to improve mobility and strength Continue vestibular therapy for benign positional vertigo Recommend good control of her cardiovascular risk factors Continue to control mood as per PCP     Subjective:    This patient is accompanied in the office by his mother (58 years old) who supplements the history.  Previous records as well as any outside records available were reviewed prior to todays visit. Patient was last seen on 07/17/2023 with [facility] 15/30.   Any changes in memory since last visit? "No changes". He does not want to elaborate, repeats the same statement "Do I have a memory issue, I did not know" repeats oneself?  Endorsed Disoriented when walking into a room?  Patient denies    Leaving objects?  May misplace things but not in unusual places   Wandering behavior?  denies    Any personality changes since last visit?  denies   Any worsening depression?:  Endorsed by his mother, he refuses to see psychiatry.  Hallucinations or paranoia?  Denies.   Seizures? denies    Any sleep changes?  Denies vivid dreams, REM behavior or sleepwalking   Sleep apnea?   Denies.   Any hygiene concerns? Denies.  Independent of bathing and dressing?  Endorsed  Does the patient needs help with medications?  Patient is in charge, may be missing doses  Who is in charge of the finances?  Patient  is in charge     Any changes in appetite?  denies     Patient have trouble swallowing? Denies.   Does the patient cook? No Any headaches?   denies   Chronic back pain  Endorsed, takes multiple agents  Ambulates with difficulty? Denies.    Recent falls or head injuries? denies     Unilateral weakness, numbness or tingling? denies   Any tremors?  Denies   Any anosmia?  Denies   Any incontinence of urine? Denies  Any bowel dysfunction?   Denies      Patient lives with his 48 yo mother  Does the patient drive? 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  No acute findings.    Neuropsych evaluation 08/21/2023 briefly, results suggested significant impairment  surrounding semantic fluency and all aspects of verbal learning and memory. Additional weaknesses were exhibited across executive functioning and receptive language, while variability was exhibited across processing speed and confrontation naming. 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 I do have noteworthy concern for an underlying neurodegenerative illness, namely Alzheimer's disease. Mr. Bryant did not benefit from repeated exposure across learning  trials, exhibited very poor retention rates across verbal measures, and performed poorly across yes/no recognition trials. Taken together, this raises concern for rapid forgetting and an evolving and already pronounced storage impairment. Additional impairment/weakness surrounding semantic fluency, confrontation naming, and executive functioning would follow a fairly classic progression. Visual memory remained intact which is encouraging and could suggest this illness being in somewhat earlier stages if truly present. This should certainly remain on his differential. He was diagnosed with an alcohol-related dementia presentation by his prior neurologist in [place] . Given chronically elevated consumption rates, this also remains a plausible. Testing wise, [name] exhibited normal performances across attention/concentration and visuospatial abilities, which would be atypical for a classic alcohol dementia presentation. 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 However, this remains somewhat speculative at the present time.     Initial visit 07/17/2023 How long did patient have memory difficulties?  For about 6 months, worse over the last month. "Iittle things "patient reports some difficulty remembering new information, recent conversations, names.  He has recently moved from [place]  to live with his mother after his recent divorce 4 months ago. Repeats oneself?  Endorsed Disoriented when walking into a room?  Patient denies Leaving objects in unusual places?  denies   Wandering behavior?  Denies Any personality changes, or depression, anxiety?  He has a history of anxiety, situational stress caring for his 53 year old mother and a recent separation from his wife 4 months ago.  Hallucinations or paranoia?   Not recently.  3 years ago he may have seen a shadow, a black figure but he states that was his wife instead.  There were no recurrences. Seizures? Denies.    Any sleep changes?  Does not sleep well if he does not take Xanax . Denies vivid dreams, REM behavior or sleepwalking  Sleep apnea? Denies.   Any hygiene concerns?  Denies.   Independent of bathing and dressing? Denies  Does the patient need help with medications? Patient is in charge   Who is in charge of the finances?  Mother is in charge     Any changes in appetite?   Denies.     Patient have trouble swallowing?  Denies.   Does the patient cook? No  Any headaches?  He has intermittent tension headaches  Chronic pain?  He has a history of intermittent right lower back pain which is chronic, sees ortho care. Ambulates with difficulty?  Tries to remain active, walks outside, plays tennis. Recent falls or head injuries? Denies.     Vision changes?  Denies any new issues.   Any strokelike symptoms? Denies.   Any tremors? Denies.  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 Denies.   Any bowel dysfunction? Denies.      Patient lives with his mother History of heavy alcohol intake?  Denies any alcohol History of heavy tobacco use? Denies.   Family history of dementia?  Denies   Does patient drive? No  He has a Scientist, water quality in psychology Retired Psychologist, occupational with the [employer] at [employer]  PREVIOUS MEDICATIONS:   CURRENT MEDICATIONS:  Outpatient Encounter Medications as of 11/14/2023  Medication Sig   ALPRAZolam  (XANAX ) 1 MG tablet TAKE 1 TABLET BY MOUTH 2 TIMES DAILY AS NEEDED FOR ANXIETY.   aspirin  EC 81 MG tablet Take 1 tablet (81 mg total) by mouth daily. Swallow whole.   fluticasone (FLONASE) 50 MCG/ACT nasal spray Place 2 sprays into both nostrils daily.   HYDROcodone -acetaminophen  (NORCO/VICODIN) 5-325 MG tablet Take 1 tablet by mouth every 6 (six) hours as needed.   methocarbamol   (ROBAXIN ) 500 MG tablet Take 1 tablet (500 mg total) by mouth 2 (two) times daily.   methocarbamol  (ROBAXIN ) 500 MG tablet Take 1 tablet (500 mg total) by mouth 2 (two) times daily as needed for muscle spasms.   naproxen  (NAPROSYN ) 375 MG tablet Take 1 tablet (375 mg total) by mouth 2 (two) times daily.   oxyCODONE -acetaminophen  (PERCOCET/ROXICET) 5-325 MG tablet Take 1 tablet by mouth every 6 (six) hours as needed for severe pain (pain score 7-10).   sildenafil  (VIAGRA ) 100 MG tablet Take 0.5-1 tablets (50-100 mg total) by mouth daily as needed for erectile dysfunction.   [DISCONTINUED] donepezil (ARICEPT) 10 MG tablet Take half tablet (5 mg) daily for 2 weeks, then increase to the full tablet at 10 mg daily   [DISCONTINUED] memantine  (NAMENDA ) 5 MG tablet Take 1 tablet (5 mg at night) for 2 weeks, then increase to 1 tablet (5 mg) twice a day   amoxicillin (AMOXIL) 500 MG capsule Take 500 mg by mouth 3 (three) times daily. (Patient not taking: Reported on 11/14/2023)   memantine  (NAMENDA ) 10 MG tablet Take 1 tablet  twice a day   No facility-administered encounter medications on file as of 11/14/2023.        No data to display            07/17/2023    1:00 PM  Montreal Cognitive Assessment   Visuospatial/ Executive (0/5) 1  Naming (0/3) 3  Attention: Read list of digits (0/2) 2  Attention: Read list of letters (0/1) 1  Attention: Serial 7 subtraction starting at 100 (0/3) 2  Language: Repeat phrase (0/2) 0  Language : Fluency (0/1) 1  Abstraction (0/2) 1  Delayed Recall (0/5) 1  Orientation (0/6) 3  Total 15  Adjusted Score (based on education) 15    Objective:     PHYSICAL EXAMINATION:    VITALS:   Vitals:   11/14/23 1256  BP: 126/71  Resp: 18  SpO2: 98%  Weight: 144 lb (65.3 kg)    GEN:  The patient appears stated age and is in NAD. HEENT:  Normocephalic, atraumatic.   Neurological examination:  General: NAD, well-groomed, appears stated age. Orientation: The  patient is alert. Oriented to person, not to place and date Cranial nerves: There is good facial symmetry.The speech is fluent and clear, verbal and tangential at times. No aphasia or dysarthria. Fund of knowledge is reduced. Recent and remote memory are impaired. Attention and concentration are reduced.  Able to name objects and repeat phrases.  Hearing is intact to conversational tone.   Sensation: Sensation is intact to light touch throughout Motor: Strength is at least antigravity x4. DTR's 2/4 in UE/LE     Movement examination: Tone: There is normal tone in the UE/LE Abnormal movements:  no tremor.  No myoclonus.  No asterixis.   Coordination:  There is  no decremation with RAM's. Normal finger to nose  Gait and Station: The patient has no  difficulty arising out of a deep-seated chair without the use of the hands. The patient's stride length is good.  Gait is cautious and narrow.    Thank you for allowing us  the opportunity to participate in the care of this nice patient. Please do not hesitate to contact us  for any questions or concerns.   Total time spent on today's visit was 30 minutes dedicated to this patient today, preparing to see patient, examining the patient, ordering tests and/or medications and counseling the patient, documenting clinical information in the EHR or other health record, independently interpreting results and communicating results to the patient/family, discussing treatment and goals, answering patient's questions and coordinating care.  Cc:  Roslyn Coombe, MD  Tex Filbert 11/14/2023 3:07 PM

## 2023-11-14 NOTE — Patient Instructions (Addendum)
 It was a pleasure to see you today at our office.   Recommendations:    Increase Memantine  to 10 mg   twice daily.    Follow up in 6 month  Resume  vestibular therapy for benign positional vertigo  Recommend primary doctor to give you something for sleep other than xanax  ( for ex trazodone)         For psychiatric meds, mood meds: Please have your primary care physician manage these medications.  If you have any severe symptoms of a stroke, or other severe issues such as confusion,severe chills or fever, etc call 911 or go to the ER as you may need to be evaluated further    For assessment of decision of mental capacity and competency:  Call Dr. Laverne Potter, geriatric psychiatrist at [phone]  Counseling regarding caregiver distress, including caregiver depression, anxiety and issues regarding community resources, adult day care programs, adult living facilities, or memory care questions:  please contact your  Primary Doctor's Social Worker   Whom to call: Memory  decline, memory medications: Call our office [phone]    [email or web address]   RECOMMENDATIONS FOR ALL PATIENTS WITH MEMORY PROBLEMS: 1. Continue to exercise (Recommend 30 minutes of walking everyday, or 3 hours every week) 2. Increase social interactions - continue going to [place] and enjoy social gatherings with friends and family 3. Eat healthy, avoid fried foods and eat more fruits and vegetables 4. Maintain adequate blood pressure, blood sugar, and blood cholesterol level. Reducing the risk of stroke and cardiovascular disease also helps promoting better memory. 5. Avoid stressful situations. Live a simple life and avoid aggravations. Organize your time and prepare for the next day in anticipation. 6. Sleep well, avoid any interruptions of sleep and avoid any distractions in the bedroom that may interfere with adequate sleep quality 7. Avoid sugar, avoid  sweets as there is a strong link between excessive sugar intake, diabetes, and cognitive impairment We discussed the Mediterranean diet, which has been shown to help patients reduce the risk of progressive memory disorders and reduces cardiovascular risk. This includes eating fish, eat fruits and green leafy vegetables, nuts like almonds and hazelnuts, walnuts, and also use olive oil. Avoid fast foods and fried foods as much as possible. Avoid sweets and sugar as sugar use has been linked to worsening of memory function.  There is always a concern of gradual progression of memory problems. If this is the case, then we may need to adjust level of care according to patient needs. Support, both to the patient and caregiver, should then be put into place.      You have been referred for a neuropsychological evaluation (i.e., evaluation of memory and thinking abilities). Please bring someone with you to this appointment if possible, as it is helpful for the doctor to hear from both you and another adult who knows you well. Please bring eyeglasses and hearing aids if you wear them.    The evaluation will take approximately 3 hours and has two parts:   The first part is a clinical interview with the neuropsychologist (Dr. Kitty Perkins or Dr. Annette Barters). During the interview, the neuropsychologist will speak with you and the individual you brought to the appointment.    The second part of the evaluation is testing with the doctor's technician Bernabe Brew or Burdette Carolin). During the testing, the technician will ask you to remember different types of material, solve problems, and answer some questionnaires. Your family member will not be present for this portion  of the evaluation.   Please note: We must reserve several hours of the neuropsychologist's time and the psychometrician's time for your evaluation appointment. As such, there is a No-Show fee of $100. If you are unable to attend any of your appointments, please contact our  office as soon as possible to reschedule.      DRIVING: Regarding driving, in patients with progressive memory problems, driving will be impaired. We advise to have someone else do the driving if trouble finding directions or if minor accidents are reported. Independent driving assessment is available to determine safety of driving.   If you are interested in the driving assessment, you can contact the following:  [facility] in [place] [phone]  [facility] [phone]  [facility] [phone]  [facility] [phone] or [phone]   FALL PRECAUTIONS: Be cautious when walking. Scan the area for obstacles that may increase the risk of trips and falls. When getting up in the mornings, sit up at the edge of the bed for a few minutes before getting out of bed. Consider elevating the bed at the head end to avoid drop of blood pressure when getting up. Walk always in a well-lit room (use night lights in the walls). Avoid area rugs or power cords from appliances in the middle of the walkways. Use a walker or a cane if necessary and consider physical therapy for balance exercise. Get your eyesight checked regularly.  FINANCIAL OVERSIGHT: Supervision, especially oversight when making financial decisions or transactions is also recommended.  HOME SAFETY: Consider the safety of the kitchen when operating appliances like stoves, microwave oven, and blender. Consider having supervision and share cooking responsibilities until no longer able to participate in those. Accidents with firearms and other hazards in the house should be identified and addressed as well.   ABILITY TO BE LEFT ALONE: If patient is unable to contact 911 operator, consider using LifeLine, or when the need is there, arrange for someone to stay with patients. Smoking is a fire hazard, consider supervision or cessation. Risk of wandering should be assessed by caregiver and if  detected at any point, supervision and safe proof recommendations should be instituted.  MEDICATION SUPERVISION: Inability to self-administer medication needs to be constantly addressed. Implement a mechanism to ensure safe administration of the medications.      Mediterranean Diet A Mediterranean diet refers to food and lifestyle choices that are based on the traditions of countries located on the [place]. This way of eating has been shown to help prevent certain conditions and improve outcomes for people who have chronic diseases, like kidney disease and heart disease. What are tips for following this plan? Lifestyle  Cook and eat meals together with your family, when possible. Drink enough fluid to keep your urine clear or pale yellow. Be physically active every day. This includes: Aerobic exercise like running or swimming. Leisure activities like gardening, walking, or housework. Get 7-8 hours of sleep each night. If recommended by your health care provider, drink red wine in moderation. This means 1 glass a day for nonpregnant women and 2 glasses a day for men. A glass of wine equals 5 oz (150 mL). Reading food labels  Check the serving size of packaged foods. For foods such as rice and pasta, the serving size refers to the amount of cooked product, not dry. Check the total fat in packaged foods. Avoid foods that have saturated fat or trans fats. Check the ingredients list for added sugars,  such as corn syrup. Shopping  At the grocery store, buy most of your food from the areas near the walls of the store. This includes: Fresh fruits and vegetables (produce). Grains, beans, nuts, and seeds. Some of these may be available in unpackaged forms or large amounts (in bulk). Fresh seafood. Poultry and eggs. Low-fat dairy products. Buy whole ingredients instead of prepackaged foods. Buy fresh fruits and vegetables in-season from local farmers markets. Buy frozen fruits and  vegetables in resealable bags. If you do not have access to quality fresh seafood, buy precooked frozen shrimp or canned fish, such as tuna, salmon, or sardines. Buy small amounts of raw or cooked vegetables, salads, or olives from the deli or salad bar at your store. Stock your pantry so you always have certain foods on hand, such as olive oil, canned tuna, canned tomatoes, rice, pasta, and beans. Cooking  Cook foods with extra-virgin olive oil instead of using butter or other vegetable oils. Have meat as a side dish, and have vegetables or grains as your main dish. This means having meat in small portions or adding small amounts of meat to foods like pasta or stew. Use beans or vegetables instead of meat in common dishes like chili or lasagna. Experiment with different cooking methods. Try roasting or broiling vegetables instead of steaming or sauteing them. Add frozen vegetables to soups, stews, pasta, or rice. Add nuts or seeds for added healthy fat at each meal. You can add these to yogurt, salads, or vegetable dishes. Marinate fish or vegetables using olive oil, lemon juice, garlic, and fresh herbs. Meal planning  Plan to eat 1 vegetarian meal one day each week. Try to work up to 2 vegetarian meals, if possible. Eat seafood 2 or more times a week. Have healthy snacks readily available, such as: Vegetable sticks with hummus. Greek yogurt. Fruit and nut trail mix. Eat balanced meals throughout the week. This includes: Fruit: 2-3 servings a day Vegetables: 4-5 servings a day Low-fat dairy: 2 servings a day Fish, poultry, or lean meat: 1 serving a day Beans and legumes: 2 or more servings a week Nuts and seeds: 1-2 servings a day Whole grains: 6-8 servings a day Extra-virgin olive oil: 3-4 servings a day Limit red meat and sweets to only a few servings a month What are my food choices? Mediterranean diet Recommended Grains: Whole-grain pasta. Brown rice. Bulgar wheat. Polenta.  Couscous. Whole-wheat bread. Dwyane Glad. Vegetables: Artichokes. Beets. Broccoli. Cabbage. Carrots. Eggplant. Green beans. Chard. Kale. Spinach. Onions. Leeks. Peas. Squash. Tomatoes. Peppers. Radishes. Fruits: Apples. Apricots. Avocado. Berries. Bananas. Cherries. Dates. Figs. Grapes. Lemons. Melon. Oranges. Peaches. Plums. Pomegranate. Meats and other protein foods: Beans. Almonds. Sunflower seeds. Pine nuts. Peanuts. Cod. Salmon. Scallops. Shrimp. Tuna. Tilapia. Clams. Oysters. Eggs. Dairy: Low-fat milk. Cheese. Greek yogurt. Beverages: Water. Red wine. Herbal tea. Fats and oils: Extra virgin olive oil. Avocado oil. Grape seed oil. Sweets and desserts: [place] yogurt with honey. Baked apples. Poached pears. Trail mix. Seasoning and other foods: Basil. Cilantro. Coriander. Cumin. Mint. Parsley. Sage. Rosemary. Tarragon. Garlic. Oregano. Thyme. Pepper. Balsalmic vinegar. Tahini. Hummus. Tomato sauce. Olives. Mushrooms. Limit these Grains: Prepackaged pasta or rice dishes. Prepackaged cereal with added sugar. Vegetables: Deep fried potatoes (french fries). Fruits: Fruit canned in syrup. Meats and other protein foods: Beef. Pork. Lamb. Poultry with skin. Hot dogs. Helene Loader. Dairy: Ice cream. Sour cream. Whole milk. Beverages: Juice. Sugar-sweetened soft drinks. Beer. Liquor and spirits. Fats and oils: Butter. Canola oil. Vegetable oil. Beef fat (  tallow). Lard. Sweets and desserts: Cookies. Cakes. Pies. Candy. Seasoning and other foods: Mayonnaise. Premade sauces and marinades. The items listed may not be a complete list. Talk with your dietitian about what dietary choices are right for you. Summary The Mediterranean diet includes both food and lifestyle choices. Eat a variety of fresh fruits and vegetables, beans, nuts, seeds, and whole grains. Limit the amount of red meat and sweets that you eat. Talk with your health care provider about whether it is safe for you to drink red wine in  moderation. This means 1 glass a day for nonpregnant women and 2 glasses a day for men. A glass of wine equals 5 oz (150 mL). This information is not intended to replace advice given to you by your health care provider. Make sure you discuss any questions you have with your health care provider. Document Released: 02/15/2016 Document Revised: 03/19/2016 Document Reviewed: 02/15/2016 Elsevier Interactive Patient Education  2017 [employer].

## 2023-12-04 ENCOUNTER — Encounter: Payer: Self-pay | Admitting: Internal Medicine

## 2023-12-04 ENCOUNTER — Other Ambulatory Visit: Payer: Self-pay | Admitting: Internal Medicine

## 2023-12-04 ENCOUNTER — Ambulatory Visit (INDEPENDENT_AMBULATORY_CARE_PROVIDER_SITE_OTHER): Payer: Medicare Other | Admitting: Internal Medicine

## 2023-12-04 ENCOUNTER — Ambulatory Visit: Payer: Self-pay | Admitting: Internal Medicine

## 2023-12-04 VITALS — BP 124/78 | HR 55 | Temp 97.9°F | Ht 65.0 in | Wt 144.0 lb

## 2023-12-04 DIAGNOSIS — R270 Ataxia, unspecified: Secondary | ICD-10-CM | POA: Insufficient documentation

## 2023-12-04 DIAGNOSIS — R3129 Other microscopic hematuria: Secondary | ICD-10-CM

## 2023-12-04 DIAGNOSIS — R739 Hyperglycemia, unspecified: Secondary | ICD-10-CM | POA: Diagnosis not present

## 2023-12-04 DIAGNOSIS — C61 Malignant neoplasm of prostate: Secondary | ICD-10-CM | POA: Diagnosis not present

## 2023-12-04 DIAGNOSIS — M48061 Spinal stenosis, lumbar region without neurogenic claudication: Secondary | ICD-10-CM

## 2023-12-04 DIAGNOSIS — E559 Vitamin D deficiency, unspecified: Secondary | ICD-10-CM

## 2023-12-04 DIAGNOSIS — E7800 Pure hypercholesterolemia, unspecified: Secondary | ICD-10-CM | POA: Diagnosis not present

## 2023-12-04 DIAGNOSIS — Z0001 Encounter for general adult medical examination with abnormal findings: Secondary | ICD-10-CM | POA: Diagnosis not present

## 2023-12-04 DIAGNOSIS — E538 Deficiency of other specified B group vitamins: Secondary | ICD-10-CM | POA: Diagnosis not present

## 2023-12-04 DIAGNOSIS — R42 Dizziness and giddiness: Secondary | ICD-10-CM | POA: Insufficient documentation

## 2023-12-04 LAB — URINALYSIS, ROUTINE W REFLEX MICROSCOPIC
Bilirubin Urine: NEGATIVE
Ketones, ur: NEGATIVE
Leukocytes,Ua: NEGATIVE
Nitrite: NEGATIVE
Specific Gravity, Urine: 1.025 (ref 1.000–1.030)
Total Protein, Urine: NEGATIVE
Urine Glucose: NEGATIVE
Urobilinogen, UA: 0.2 (ref 0.0–1.0)
pH: 6 (ref 5.0–8.0)

## 2023-12-04 LAB — VITAMIN B12: Vitamin B-12: 1178 pg/mL — ABNORMAL HIGH (ref 211–911)

## 2023-12-04 LAB — HEPATIC FUNCTION PANEL
ALT: 10 U/L (ref 0–53)
AST: 14 U/L (ref 0–37)
Albumin: 4.3 g/dL (ref 3.5–5.2)
Alkaline Phosphatase: 66 U/L (ref 39–117)
Bilirubin, Direct: 0.1 mg/dL (ref 0.0–0.3)
Total Bilirubin: 0.5 mg/dL (ref 0.2–1.2)
Total Protein: 6.8 g/dL (ref 6.0–8.3)

## 2023-12-04 LAB — LIPID PANEL
Cholesterol: 193 mg/dL (ref 0–200)
HDL: 53.7 mg/dL (ref >39.00)
LDL Cholesterol: 121 mg/dL — ABNORMAL HIGH (ref 0–99)
NonHDL: 139.41
Total CHOL/HDL Ratio: 4
Triglycerides: 92 mg/dL (ref 0.0–149.0)
VLDL: 18.4 mg/dL (ref 0.0–40.0)

## 2023-12-04 LAB — CBC WITH DIFFERENTIAL/PLATELET
Basophils Absolute: 0 10*3/uL (ref 0.0–0.1)
Basophils Relative: 0.6 % (ref 0.0–3.0)
Eosinophils Absolute: 0 10*3/uL (ref 0.0–0.7)
Eosinophils Relative: 0.5 % (ref 0.0–5.0)
HCT: 45.9 % (ref 39.0–52.0)
Hemoglobin: 15 g/dL (ref 13.0–17.0)
Lymphocytes Relative: 23.4 % (ref 12.0–46.0)
Lymphs Abs: 1 10*3/uL (ref 0.7–4.0)
MCHC: 32.7 g/dL (ref 30.0–36.0)
MCV: 91.3 fl (ref 78.0–100.0)
Monocytes Absolute: 0.4 10*3/uL (ref 0.1–1.0)
Monocytes Relative: 10.6 % (ref 3.0–12.0)
Neutro Abs: 2.7 10*3/uL (ref 1.4–7.7)
Neutrophils Relative %: 64.9 % (ref 43.0–77.0)
Platelets: 148 10*3/uL — ABNORMAL LOW (ref 150.0–400.0)
RBC: 5.02 Mil/uL (ref 4.22–5.81)
RDW: 13.7 % (ref 11.5–15.5)
WBC: 4.2 10*3/uL (ref 4.0–10.5)

## 2023-12-04 LAB — BASIC METABOLIC PANEL WITH GFR
BUN: 15 mg/dL (ref 6–23)
CO2: 31 meq/L (ref 19–32)
Calcium: 9.3 mg/dL (ref 8.4–10.5)
Chloride: 104 meq/L (ref 96–112)
Creatinine, Ser: 0.88 mg/dL (ref 0.40–1.50)
GFR: 81.98 mL/min (ref >60.00)
Glucose, Bld: 74 mg/dL (ref 70–99)
Potassium: 4.5 meq/L (ref 3.5–5.1)
Sodium: 141 meq/L (ref 135–145)

## 2023-12-04 LAB — HEMOGLOBIN A1C: Hgb A1c MFr Bld: 5.6 % (ref 4.6–6.5)

## 2023-12-04 LAB — VITAMIN D 25 HYDROXY (VIT D DEFICIENCY, FRACTURES): VITD: 44.27 ng/mL (ref 30.00–100.00)

## 2023-12-04 LAB — PSA: PSA: 0.08 ng/mL — ABNORMAL LOW (ref 0.10–4.00)

## 2023-12-04 LAB — TSH: TSH: 0.78 u[IU]/mL (ref 0.35–5.50)

## 2023-12-04 NOTE — Assessment & Plan Note (Signed)
 D/w pt - consider MRI brain for now, also for neurology referral

## 2023-12-04 NOTE — Progress Notes (Signed)
 Patient ID: Seth Bryant, male   DOB: 06-Jan-1946, 78 y.o.   MRN: [id number]         Chief Complaint:: wellness exam and dizziness ataxia to stand and walk, chronic lbp; hx of prostate ca, low b12       HPI:  Seth Bryant is a 78 y.o. male here for wellness exam; for shingrix at pharmacy, o/w up to date                        Also Pt denies chest pain, increased sob or doe, wheezing, orthopnea, PND, increased LE swelling, palpitations, dizziness or syncope.   Pt denies polydipsia, polyuria, but does have unusual onset gradually worsening about 6 or more months of marked dizziness and spinning with standing and first steps, gets ataxic to start, only getting worse recently.  Pt continues to have recurring LBP without change in severity, bowel or bladder change, fever, wt loss,  worsening LE pain/numbness/weakness, gait change or falls.  Denies urinary symptoms such as dysuria, frequency, urgency, flank pain, hematuria or n/v, fever, chills.     Wt Readings from Last 3 Encounters:  12/04/23 144 lb (65.3 kg)  11/14/23 144 lb (65.3 kg)  08/01/23 147 lb 9.6 oz (67 kg)   BP Readings from Last 3 Encounters:  12/04/23 124/78  11/14/23 126/71  08/01/23 110/70   Immunization History  Administered Date(s) Administered   Fluad Quad(high Dose 65+) 04/25/2022   Influenza, High Dose Seasonal PF 07/14/2017, 03/11/2019, 05/03/2020   PFIZER(Purple Top)SARS-COV-2 Vaccination 09/13/2019, 10/04/2019, 05/03/2020   Pneumococcal Conjugate-13 01/15/2016   Pneumococcal Polysaccharide-23 11/27/2012, 03/31/2017   Tdap 11/27/2012, 11/05/2017   Zoster, Live 04/01/2014   Health Maintenance Due  Topic Date Due   Zoster Vaccines- Shingrix (1 of 2) 10/05/1963      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   Age-related nuclear cataract of both eyes 11/10/2014   Alcoholism 11/07/2014   Anesthesia of skin 09/19/2005   Arthralgia of multiple sites  01/28/2008   Benign neoplasm of large intestine 05/23/2008   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   14Mar2022: Patient continues with alprazolam  1 mg he takes 1 at night-pt counselled re the safety concern with  this medicaion use in combination with ETOHa nd pain meds- pt and wife aware that high risk with these meds for injury- fall, addiction, and even possible death from respiratory supression.- new contract signed by pt with wife present; Impression - 26May2022: Patient continu   Cervical spondylosis 11/26/2006   Chronic pain 09/27/2016   Digital nerve laceration, finger 11/12/2017   Diverticulosis of colon 10/09/2015   Drug-induced photosensitivity 09/23/2019   Elevated PSA 03/04/2019   26May2022: Patient's PSA is up to 8-pending surgical procedure for prostate biopsies with Dr. Dawn Eth today's visit is for preop clearance   Fatigue 10/14/2008   01May2023: Fatigue likely associated with low testosterone levels recent treatment of prostate cancer increasing age decreasing activity levels   Flexor tendon rupture of hand 11/12/2017   Frequent falls 04/25/2022   19Oct2023: The etiology of the falls is unclear but per the wife likely associated with alcohol intake and patient losing his footing-there is been no loss of consciousness-ER evaluation with head CT scan was negative counseled about drinking and using an assistive walking device could be helpful   Generalized anxiety disorder 06/05/2023   Insomnia 04/01/2014   Internal hemorrhoid 10/09/2015   Lateral epicondylitis 06/14/2009   Lumbar  spondylosis 11/04/2005   01May2023: Chronic ongoing lower back pain intermittent patient uses Vicodin and/or Tylenol  sometimes nonsteroidal anti-inflammatories for discomfort   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   Multiple lacunar infarcts    Neck pain 11/26/2006   Neoplasm of uncertain behavior of skin 07/18/2014   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   14Mar2022: as above- pt taking hydrocodone  prn- not taking  every day; Impression - 09Feb2023: Patient taking not taking every day cures done today no aberrant behavior; Impression - 01May2023: Patient taking not taking every day cures done today no aberrant behavior-strongly encourage patient to wean off of this medication Tylenol  only as needed new contract has been established   Polymyalgia rheumatica 06/01/2008   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 He has never used smokeless tobacco. He reports current alcohol use. He reports that he does not currently use drugs. family history includes Heart failure in his father. Allergies  Allergen Reactions   Latex    Current Outpatient Medications on File Prior to Visit  Medication Sig Dispense Refill   ALPRAZolam  (XANAX ) 1 MG tablet TAKE 1 TABLET BY MOUTH 2 TIMES DAILY AS NEEDED FOR ANXIETY. 60 tablet 2   aspirin  EC 81 MG tablet Take 1 tablet (81 mg total) by mouth daily. Swallow whole.     donepezil  (ARICEPT ) 10 MG tablet Take by mouth.     fluticasone (FLONASE) 50 MCG/ACT nasal spray Place 2 sprays into both nostrils daily.     HYDROcodone -acetaminophen  (NORCO/VICODIN) 5-325 MG tablet Take 1 tablet by mouth every 6 (six) hours as needed. 60 tablet 0   memantine  (NAMENDA ) 10 MG tablet Take 1 tablet  twice a day 180 tablet 3   methocarbamol  (ROBAXIN ) 500 MG tablet Take 1 tablet (500 mg total) by mouth 2 (two) times daily. 20 tablet 0   methocarbamol  (ROBAXIN ) 500 MG tablet Take 1 tablet (500 mg total) by mouth 2 (two) times daily as needed for muscle spasms. 20 tablet 0   naproxen  (NAPROSYN ) 375 MG tablet  Take 1 tablet (375 mg total) by mouth 2 (two) times daily. 20 tablet 0   oxyCODONE -acetaminophen  (PERCOCET/ROXICET) 5-325 MG tablet Take 1 tablet by mouth every 6 (six) hours as needed for severe pain (pain score 7-10). 12 tablet 0   sildenafil  (VIAGRA ) 100 MG tablet Take 0.5-1 tablets (50-100 mg total) by mouth daily as needed for erectile dysfunction. 10 tablet 11   triamcinolone cream (KENALOG) 0.1 % Apply 1 applicator topically.     amoxicillin (AMOXIL) 500 MG capsule Take 500 mg by mouth 3 (three) times daily. (Patient not taking: Reported on 12/04/2023)     No current facility-administered medications on file prior to visit.        ROS:  All others reviewed and negative.  Objective        PE:  BP 124/78 (BP Location: Right Arm, Patient Position: Sitting, Cuff Size: Normal)   Pulse (!) 55   Temp 97.9 F (36.6 C) (Oral)   Ht 5\' 5"  (1.651 m)   Wt 144 lb (65.3 kg)  SpO2 99%   BMI 23.96 kg/m                 Constitutional: Pt appears in NAD               HENT: Head: NCAT.                Right Ear: External ear normal.                 Left Ear: External ear normal.                Eyes: . Pupils are equal, round, and reactive to light. Conjunctivae and EOM are normal               Nose: without d/c or deformity               Neck: Neck supple. Gross normal ROM               Cardiovascular: Normal rate and regular rhythm.                 Pulmonary/Chest: Effort normal and breath sounds without rales or wheezing.                Abd:  Soft, NT, ND, + BS, no organomegaly               Neurological: Pt is alert. At baseline orientation, motor grossly intact, cn 2-12 intact, has overt signs of dizziness and ataxic gait to stand from sitting to get up on exam table               Skin: Skin is warm. No rashes, no other new lesions, LE edema - none               Psychiatric: Pt behavior is normal without agitation   Micro: none  Cardiac tracings I have personally interpreted today:   none  Pertinent Radiological findings (summarize): none   Lab Results  Component Value Date   WBC 4.2 12/04/2023   HGB 15.0 12/04/2023   HCT 45.9 12/04/2023   PLT 148.0 (L) 12/04/2023   GLUCOSE 74 12/04/2023   CHOL 193 12/04/2023   TRIG 92.0 12/04/2023   HDL 53.70 12/04/2023   LDLCALC 121 (H) 12/04/2023   ALT 10 12/04/2023   AST 14 12/04/2023   NA 141 12/04/2023   K 4.5 12/04/2023   CL 104 12/04/2023   CREATININE 0.88 12/04/2023   BUN 15 12/04/2023   CO2 31 12/04/2023   TSH 0.78 12/04/2023   PSA 0.08 (L) 12/04/2023   INR 1.0 05/06/2019   HGBA1C 5.6 12/04/2023   Assessment/Plan:  Bryse Blanchette is a 78 y.o. White or Caucasian [1] male with  has a past medical history of Actinic keratosis (03/07/2008), Adenocarcinoma of prostate (12/19/2020), Age-related nuclear cataract of both eyes (11/10/2014), Alcoholism (11/07/2014), Anesthesia of skin (09/19/2005), Arthralgia of multiple sites (01/28/2008), Benign neoplasm of large intestine (05/23/2008), Benzodiazepine dependence (12/27/2019), Cervical spondylosis (11/26/2006), Chronic pain (09/27/2016), Digital nerve laceration, finger (11/12/2017), Diverticulosis of colon (10/09/2015), Drug-induced photosensitivity (09/23/2019), Elevated PSA (03/04/2019), Fatigue (10/14/2008), Flexor tendon rupture of hand (11/12/2017), Frequent falls (04/25/2022), Generalized anxiety disorder (06/05/2023), Insomnia (04/01/2014), Internal hemorrhoid (10/09/2015), Lateral epicondylitis (06/14/2009), Lumbar spondylosis (11/04/2005), Mixed dementia (08/14/2023), Multiple lacunar infarcts, Neck pain (11/26/2006), Neoplasm of uncertain behavior of skin (07/18/2014), Opium  dependence (11/22/2019), Polymyalgia rheumatica (06/01/2008), Prurigo nodularis (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  Adenocarcinoma of prostate Also for psa with labs  Encounter for well adult exam with abnormal findings Age and sex appropriate education and counseling updated with regular exercise and diet Referrals for preventative services - none needed Immunizations addressed - for shingrix at pharmacy Smoking counseling  - none needed Evidence for depression or other mood disorder - none significant Most recent labs reviewed. I have personally reviewed and have noted: 1) the patient's medical and social history 2) The patient's current medications and supplements 3) The patient's height, weight, and BMI have been recorded in the chart   Vitamin B12 deficiency Lab Results  Component Value Date   VITAMINB12 1,178 (H) 12/04/2023   Stable, cont oral replacement - b12 1000 mcg qd   Spinal stenosis of lumbar region With chronic stable lower back pain except for gait difficulty on standing, declines need for PT  Dizziness ? Vertigo vs other, declnes antivert for now  Ataxia, unspecified D/w pt - consider MRI brain for now, also for neurology referral  Followup: Return in about 6 months (around 06/05/2024).  Rosalia Colonel, MD 12/04/2023 9:02 PM [facility]

## 2023-12-04 NOTE — Patient Instructions (Signed)
 Please have your Shingrix (shingles) shots done at your local pharmacy.  Please continue all other medications as before, and refills have been done if requested.  Please have the pharmacy call with any other refills you may need.  Please continue your efforts at being more active, low cholesterol diet, and weight control.  You are otherwise up to date with prevention measures today.  Please keep your appointments with your specialists as you may have planned  You will be contacted regarding the referral for: Neurology  Please go to the LAB at the blood drawing area for the tests to be done  You will be contacted by phone if any changes need to be made immediately.  Otherwise, you will receive a letter about your results with an explanation, but please check with MyChart first.  Please make an Appointment to return in 6 months, or sooner if needed

## 2023-12-04 NOTE — Assessment & Plan Note (Signed)

## 2023-12-04 NOTE — Assessment & Plan Note (Signed)
 With chronic stable lower back pain except for gait difficulty on standing, declines need for PT

## 2023-12-04 NOTE — Assessment & Plan Note (Signed)
 Lab Results  Component Value Date   VITAMINB12 1,178 (H) 12/04/2023   Stable, cont oral replacement - b12 1000 mcg qd

## 2023-12-04 NOTE — Assessment & Plan Note (Signed)
Also for psa with labs 

## 2023-12-04 NOTE — Assessment & Plan Note (Signed)
?   Vertigo vs other, declnes antivert for now

## 2023-12-05 ENCOUNTER — Telehealth: Payer: Self-pay

## 2023-12-05 NOTE — Telephone Encounter (Signed)
 Copied from CRM [id number]. Topic: Clinical - Lab/Test Results >> Dec 05, 2023  1:31 PM [place] wrote: Reason for CRM: Patient is asking for a nurse or Dr Autry Legions to contact him about the lab work he had done the other day.

## 2023-12-08 NOTE — Telephone Encounter (Signed)
 Called and went over lab results, Pt states understanding no further questions at this time.

## 2023-12-09 NOTE — Telephone Encounter (Signed)
 Copied from CRM [id number]. Topic: General - Other >> Dec 09, 2023  1:27 PM Trula Gable C wrote: Reason for CRM: Patient called in regarding lab results, he stated that he need further information regarding if he  needs to be seen again, would like for Dr.Arsal or nurses to give him a callback on what to do

## 2023-12-11 NOTE — Telephone Encounter (Signed)
 Called and spoke with Pt wife and let her that he needs to follow up with urology ad someone will call him to schedule that appt.

## 2023-12-28 NOTE — Progress Notes (Signed)
 [facility] Clinic Note - Initial Visit   Date: 12/30/23  Seth Bryant MRN: [id number] DOB: [birth year]   Dear Dr Norleen, Lynwood ORN, MD:  Thank you for your kind referral of Norleen Sharps for consultation of vertigo. Although his history is well known to you, please allow us  to reiterate it for the purpose of our medical record. The patient was accompanied to the clinic by his 79 year old mother who also provides collateral information.      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 HE never completed the therapy although he admits that was making progress.Last MRI of the brain on December 2024, personally reviewed showed mild chronic small vessel ischemic changes within the cerebral white matter, chronic lacunar infarcts within the left basal ganglia as well as mild to moderate generalized cerebral atrophy with mild cerebellar atrophy, without acute findings. We discussed resuming his VT, he agreed   Re-Refer to Vestibular Therapy Recommend decreasing  Xanax  1 mg a day    Mild dementia of unclear etiology concern for Alzheimer's disease Prior alcohol abuse and dependence  Continue memantine  10 mg twice daily, side effects discussed Continue donepezil  10 mg daily Replenish B12 Avoid  pain medications and muscle relaxers, excess Xanax  as this could contribute to memory decline    History of present illness  Abrupt or gradual onset? Chronic, for the last 5-6 months . L or R handed? L handed Vertigo?  Endorsed when getting up, spinning a little bit , needs a little time to adjust.  When  sitting is better, from the back of the head to the ears in both sides3 days ago was associated with nausea and vomiting, no nystagmus. Reports a history of decreased hearing with chronic tinnitus, whistling   Presyncope?  Denies  Unsteadiness when walking ? NO  Non-specific dizziness? Denies     Triggered episodic vestibular syndrome lasts < 1 minute  Difficulty with vision ?NO Difficulty with hearing? Not a real problem. Paresthesia (tingling/numbness) in feet/legs?NO Weakness or incoordination of legs? L  leg ataxia  History/frequency of falls? NO  Chest pain?  NO History of diabetes and on insulin or oral hypoglycemic meds?  No  Provoking symptoms such as  change in posture? when standing up.  Rolling over in bed?yes  Bending over/straightening up?yes Cough/sneeze/straining? NO Medication use, especially any new medications or recent change in medication dosing?NO Substance use?  NO Stress/emotional issues?  Yes, has a history of anxiety and depression      Review of systems Constitutional: nausea, vomiting, sweating Ears, Nose, Throat: ear discharge, hearing loss, chronic tinnitus. NO  symptoms of recent viral upper respiratory infection   Cardiovascular: chest pain, palpitations Gastrointestinal: diarrhea Musculoskeletal: muscle weakness, muscle spasm, joint pain, hip arthritis, frequent falls Neurological: double vision, numbness, focal weakness, difficulty speaking, difficulty swallowing, profound imbalance, headache Psychiatric: depression, anxiety, emotional stress     Out-side paper records, electronic medical record, and images have been reviewed where available and summarized as:   Lab Results  Component Value Date   HGBA1C 5.6 12/04/2023   Lab Results  Component Value Date   VITAMINB12 1,178 (H) 12/04/2023   Lab Results  Component Value Date   TSH 0.78 12/04/2023   No results found for: ELIGIO SANDHOFF  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   Age-related nuclear cataract of both  eyes 11/10/2014   Alcoholism 11/07/2014   Anesthesia of skin 09/19/2005   Arthralgia of multiple sites 01/28/2008   Benign neoplasm of large intestine 05/23/2008   Benzodiazepine  dependence 12/27/2019   14Mar2022: Patient continues with alprazolam  1 mg he takes 1 at night-pt counselled re the safety concern with  this medicaion use in combination with ETOHa nd pain meds- pt and wife aware that high risk with these meds for injury- fall, addiction, and even possible death from respiratory supression.- new contract signed by pt with wife present; Impression - 26May2022: Patient continu   Cervical spondylosis 11/26/2006   Chronic pain 09/27/2016   Digital nerve laceration, finger 11/12/2017   Diverticulosis of colon 10/09/2015   Drug-induced photosensitivity 09/23/2019   Elevated PSA 03/04/2019   26May2022: Patient's PSA is up to 8-pending surgical procedure for prostate biopsies with Dr. Donah today's visit is for preop clearance   Fatigue 10/14/2008   01May2023: Fatigue likely associated with low testosterone levels recent treatment of prostate cancer increasing age decreasing activity levels   Flexor tendon rupture of hand 11/12/2017   Frequent falls 04/25/2022   19Oct2023: The etiology of the falls is unclear but per the wife likely associated with alcohol intake and patient losing his footing-there is been no loss of consciousness-ER evaluation with head CT scan was negative counseled about drinking and using an assistive walking device could be helpful   Generalized anxiety disorder 06/05/2023   Insomnia 04/01/2014   Internal hemorrhoid 10/09/2015   Lateral epicondylitis 06/14/2009   Lumbar spondylosis 11/04/2005   01May2023: Chronic ongoing lower back pain intermittent patient uses Vicodin and/or Tylenol  sometimes nonsteroidal anti-inflammatories for discomfort   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   Multiple lacunar infarcts    Neck pain 11/26/2006   Neoplasm of uncertain behavior of skin 07/18/2014   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   14Mar2022: as above- pt taking hydrocodone  prn- not taking every day; Impression - 09Feb2023: Patient taking not taking every day cures done  today no aberrant behavior; Impression - 01May2023: Patient taking not taking every day cures done today no aberrant behavior-strongly encourage patient to wean off of this medication Tylenol  only as needed new contract has been established   Polymyalgia rheumatica 06/01/2008   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   aspirin  EC 81 MG tablet Take 1 tablet (81 mg total) by mouth daily. Swallow whole.   donepezil  (ARICEPT ) 10 MG tablet Take by mouth.   fluticasone (FLONASE) 50 MCG/ACT nasal spray Place 2 sprays into both nostrils daily.   memantine  (NAMENDA ) 10 MG tablet Take 1 tablet  twice a day   sildenafil  (VIAGRA ) 100 MG tablet Take 0.5-1 tablets (50-100 mg total) by mouth daily as needed for erectile dysfunction.   triamcinolone cream (KENALOG) 0.1 % Apply 1 applicator topically.   [DISCONTINUED] amoxicillin (AMOXIL) 500 MG capsule Take 500 mg by mouth 3 (three) times daily.   [  DISCONTINUED] HYDROcodone -acetaminophen  (NORCO/VICODIN) 5-325 MG tablet Take 1 tablet by mouth every 6 (six) hours as needed.   [DISCONTINUED] methocarbamol  (ROBAXIN ) 500 MG tablet Take 1 tablet (500 mg total) by mouth 2 (two) times daily.   [DISCONTINUED] methocarbamol  (ROBAXIN ) 500 MG tablet Take 1 tablet (500 mg total) by mouth 2 (two) times daily as needed for muscle spasms.   [DISCONTINUED] naproxen  (NAPROSYN ) 375 MG tablet Take 1 tablet (375 mg total) by mouth 2 (two)  times daily.   [DISCONTINUED] oxyCODONE -acetaminophen  (PERCOCET/ROXICET) 5-325 MG tablet Take 1 tablet by mouth every 6 (six) hours as needed for severe pain (pain score 7-10).   No facility-administered encounter medications on file as of 12/30/2023.    Allergies:  Allergies  Allergen Reactions   Latex     Family History: Family History  Problem Relation Age of Onset   Heart failure Father     Social History: Social History   Tobacco Use   Smoking status: Never   Smokeless tobacco: Never  Vaping Use   Vaping status: Never Used  Substance Use Topics   Alcohol use: Yes    Comment: hx of severe abuse/dependence   Drug use: Not Currently    Comment: alludes to prior cocaine use in younger years   Social History   Social History Narrative   Moved here from [place]   Trying to find a place to leave   Going to live with his mom   Retired   Left handed    Vital Signs:  Resp 20   Wt 141 lb (64 kg)   BMI 23.46 kg/m    General Medical Exam:    General:  Well appearing, comfortable.   Eyes/ENT: see cranial nerve examination.   Neck:   No carotid bruits. Respiratory:  Clear to auscultation, good air entry bilaterally.   Cardiac:  Regular rate and rhythm, no murmur.   Extremities:  No deformities, edema, or skin discoloration.  Skin:  No rashes or lesions.  Neurological Exam: MENTAL STATUS including orientation to time, place, person, recent and remote memory impaired, attention span and concentration decreased, language is tangential, and fund of knowledge is appropriate.  Speech is not dysarthric.  CRANIAL NERVES: II:  No visual field defects.  Fundi not visualized  III-IV-VI: Pupils equal round and reactive to light.  Normal conjugate, extra-ocular eye movements in all directions of gaze.  No nystagmus.  No ptosis.   V:  Normal facial sensation.    VII:  Normal facial symmetry and movements.   VIII:  Normal hearing and abnormal vestibular function when moving  head to the side, or sitting forward.   IX-X:  Normal palatal movement.   XI:  Normal shoulder shrug and head rotation.   XII:  Normal tongue strength and range of motion, no deviation or fasciculation.  MOTOR:  No atrophy, fasciculations. No pronator drift. LLE Ataxia noted     MSRs:  Right        Left                  brachioradialis 2+  2+  biceps 2+  2+  triceps 2+  2+  patellar 2+  2+  ankle jerk 2+  2+  Hoffman no  no  plantar response down  down   SENSORY:  Normal and symmetric perception of light touch, pinprick, vibration, and proprioception.  Romberg's sign absent.   COORDINATION/GAIT: Normal finger-to- nose-finger and heel-to-shin.  Intact rapid alternating movements bilaterally.  Able to rise  from a chair without using arms.  Gait narrow based and stable. Tandem and stressed gait intact.      Thank you for allowing me to participate in patient's care.  If I can answer any additional questions, I would be pleased to do so.    Sincerely,   Camie Sevin, PA-C

## 2023-12-30 ENCOUNTER — Ambulatory Visit: Admitting: Physician Assistant

## 2023-12-30 ENCOUNTER — Encounter: Payer: Self-pay | Admitting: Physician Assistant

## 2023-12-30 VITALS — BP 143/72 | HR 70 | Resp 20 | Wt 141.0 lb

## 2023-12-30 DIAGNOSIS — R4189 Other symptoms and signs involving cognitive functions and awareness: Secondary | ICD-10-CM

## 2023-12-30 DIAGNOSIS — R413 Other amnesia: Secondary | ICD-10-CM

## 2023-12-30 DIAGNOSIS — H8110 Benign paroxysmal vertigo, unspecified ear: Secondary | ICD-10-CM

## 2023-12-30 NOTE — Patient Instructions (Signed)
 It was a pleasure to see you today at our office.   Recommendations:    Continue  Memantine  to 10 mg   twice daily.    Continue donepezil  10 mg daily Follow up  Jan 23 at 11:30  Resume  vestibular therapy for benign positional vertigo          For psychiatric meds, mood meds: Please have your primary care physician manage these medications.  If you have any severe symptoms of a stroke, or other severe issues such as confusion,severe chills or fever, etc call 911 or go to the ER as you may need to be evaluated further    For assessment of decision of mental capacity and competency:  Call Dr. Rosaline Nine, geriatric psychiatrist at [phone]  Counseling regarding caregiver distress, including caregiver depression, anxiety and issues regarding community resources, adult day care programs, adult living facilities, or memory care questions:  please contact your  Primary Doctor's Social Worker   Whom to call: Memory  decline, memory medications: Call our office [phone]    [email or web address]   RECOMMENDATIONS FOR ALL PATIENTS WITH MEMORY PROBLEMS: 1. Continue to exercise (Recommend 30 minutes of walking everyday, or 3 hours every week) 2. Increase social interactions - continue going to [place] and enjoy social gatherings with friends and family 3. Eat healthy, avoid fried foods and eat more fruits and vegetables 4. Maintain adequate blood pressure, blood sugar, and blood cholesterol level. Reducing the risk of stroke and cardiovascular disease also helps promoting better memory. 5. Avoid stressful situations. Live a simple life and avoid aggravations. Organize your time and prepare for the next day in anticipation. 6. Sleep well, avoid any interruptions of sleep and avoid any distractions in the bedroom that may interfere with adequate sleep quality 7. Avoid sugar, avoid sweets as there is a strong link between excessive  sugar intake, diabetes, and cognitive impairment We discussed the Mediterranean diet, which has been shown to help patients reduce the risk of progressive memory disorders and reduces cardiovascular risk. This includes eating fish, eat fruits and green leafy vegetables, nuts like almonds and hazelnuts, walnuts, and also use olive oil. Avoid fast foods and fried foods as much as possible. Avoid sweets and sugar as sugar use has been linked to worsening of memory function.  There is always a concern of gradual progression of memory problems. If this is the case, then we may need to adjust level of care according to patient needs. Support, both to the patient and caregiver, should then be put into place.      You have been referred for a neuropsychological evaluation (i.e., evaluation of memory and thinking abilities). Please bring someone with you to this appointment if possible, as it is helpful for the doctor to hear from both you and another adult who knows you well. Please bring eyeglasses and hearing aids if you wear them.    The evaluation will take approximately 3 hours and has two parts:   The first part is a clinical interview with the neuropsychologist (Dr. Richie or Dr. Jackquline). During the interview, the neuropsychologist will speak with you and the individual you brought to the appointment.    The second part of the evaluation is testing with the doctor's technician Neal or Luke). During the testing, the technician will ask you to remember different types of material, solve problems, and answer some questionnaires. Your family member will not be present for this portion of the evaluation.   Please note: We  must reserve several hours of the neuropsychologist's time and the psychometrician's time for your evaluation appointment. As such, there is a No-Show fee of $100. If you are unable to attend any of your appointments, please contact our office as soon as possible to reschedule.       DRIVING: Regarding driving, in patients with progressive memory problems, driving will be impaired. We advise to have someone else do the driving if trouble finding directions or if minor accidents are reported. Independent driving assessment is available to determine safety of driving.   If you are interested in the driving assessment, you can contact the following:  [employer] in [place] [phone]  [facility] [phone]  [facility] [phone]  [facility] [phone] or [phone]   FALL PRECAUTIONS: Be cautious when walking. Scan the area for obstacles that may increase the risk of trips and falls. When getting up in the mornings, sit up at the edge of the bed for a few minutes before getting out of bed. Consider elevating the bed at the head end to avoid drop of blood pressure when getting up. Walk always in a well-lit room (use night lights in the walls). Avoid area rugs or power cords from appliances in the middle of the walkways. Use a walker or a cane if necessary and consider physical therapy for balance exercise. Get your eyesight checked regularly.  FINANCIAL OVERSIGHT: Supervision, especially oversight when making financial decisions or transactions is also recommended.  HOME SAFETY: Consider the safety of the kitchen when operating appliances like stoves, microwave oven, and blender. Consider having supervision and share cooking responsibilities until no longer able to participate in those. Accidents with firearms and other hazards in the house should be identified and addressed as well.   ABILITY TO BE LEFT ALONE: If patient is unable to contact 911 operator, consider using LifeLine, or when the need is there, arrange for someone to stay with patients. Smoking is a fire hazard, consider supervision or cessation. Risk of wandering should be assessed by caregiver and if detected at any point, supervision and  safe proof recommendations should be instituted.  MEDICATION SUPERVISION: Inability to self-administer medication needs to be constantly addressed. Implement a mechanism to ensure safe administration of the medications.      Mediterranean Diet A Mediterranean diet refers to food and lifestyle choices that are based on the traditions of countries located on the [place]. This way of eating has been shown to help prevent certain conditions and improve outcomes for people who have chronic diseases, like kidney disease and heart disease. What are tips for following this plan? Lifestyle  Cook and eat meals together with your family, when possible. Drink enough fluid to keep your urine clear or pale yellow. Be physically active every day. This includes: Aerobic exercise like running or swimming. Leisure activities like gardening, walking, or housework. Get 7-8 hours of sleep each night. If recommended by your health care provider, drink red wine in moderation. This means 1 glass a day for nonpregnant women and 2 glasses a day for men. A glass of wine equals 5 oz (150 mL). Reading food labels  Check the serving size of packaged foods. For foods such as rice and pasta, the serving size refers to the amount of cooked product, not dry. Check the total fat in packaged foods. Avoid foods that have saturated fat or trans fats. Check the ingredients list for added sugars, such as corn syrup. Shopping  At the  grocery store, buy most of your food from the areas near the walls of the store. This includes: Fresh fruits and vegetables (produce). Grains, beans, nuts, and seeds. Some of these may be available in unpackaged forms or large amounts (in bulk). Fresh seafood. Poultry and eggs. Low-fat dairy products. Buy whole ingredients instead of prepackaged foods. Buy fresh fruits and vegetables in-season from local farmers markets. Buy frozen fruits and vegetables in resealable bags. If you do  not have access to quality fresh seafood, buy precooked frozen shrimp or canned fish, such as tuna, salmon, or sardines. Buy small amounts of raw or cooked vegetables, salads, or olives from the deli or salad bar at your store. Stock your pantry so you always have certain foods on hand, such as olive oil, canned tuna, canned tomatoes, rice, pasta, and beans. Cooking  Cook foods with extra-virgin olive oil instead of using butter or other vegetable oils. Have meat as a side dish, and have vegetables or grains as your main dish. This means having meat in small portions or adding small amounts of meat to foods like pasta or stew. Use beans or vegetables instead of meat in common dishes like chili or lasagna. Experiment with different cooking methods. Try roasting or broiling vegetables instead of steaming or sauteing them. Add frozen vegetables to soups, stews, pasta, or rice. Add nuts or seeds for added healthy fat at each meal. You can add these to yogurt, salads, or vegetable dishes. Marinate fish or vegetables using olive oil, lemon juice, garlic, and fresh herbs. Meal planning  Plan to eat 1 vegetarian meal one day each week. Try to work up to 2 vegetarian meals, if possible. Eat seafood 2 or more times a week. Have healthy snacks readily available, such as: Vegetable sticks with hummus. Greek yogurt. Fruit and nut trail mix. Eat balanced meals throughout the week. This includes: Fruit: 2-3 servings a day Vegetables: 4-5 servings a day Low-fat dairy: 2 servings a day Fish, poultry, or lean meat: 1 serving a day Beans and legumes: 2 or more servings a week Nuts and seeds: 1-2 servings a day Whole grains: 6-8 servings a day Extra-virgin olive oil: 3-4 servings a day Limit red meat and sweets to only a few servings a month What are my food choices? Mediterranean diet Recommended Grains: Whole-grain pasta. Brown rice. Bulgar wheat. Polenta. Couscous. Whole-wheat bread. [place] Madeira. Vegetables: Artichokes. Beets. Broccoli. Cabbage. Carrots. Eggplant. Green beans. Chard. Kale. Spinach. Onions. Leeks. Peas. Squash. Tomatoes. Peppers. Radishes. Fruits: Apples. Apricots. Avocado. Berries. Bananas. Cherries. Dates. Figs. Grapes. Lemons. Melon. Oranges. Peaches. Plums. Pomegranate. Meats and other protein foods: Beans. Almonds. Sunflower seeds. Pine nuts. Peanuts. Cod. Salmon. Scallops. Shrimp. Tuna. Tilapia. Clams. Oysters. Eggs. Dairy: Low-fat milk. Cheese. Greek yogurt. Beverages: Water. Red wine. Herbal tea. Fats and oils: Extra virgin olive oil. Avocado oil. Grape seed oil. Sweets and desserts: [place] yogurt with honey. Baked apples. Poached pears. Trail mix. Seasoning and other foods: Basil. Cilantro. Coriander. Cumin. Mint. Parsley. Sage. Rosemary. Tarragon. Garlic. Oregano. Thyme. Pepper. Balsalmic vinegar. Tahini. Hummus. Tomato sauce. Olives. Mushrooms. Limit these Grains: Prepackaged pasta or rice dishes. Prepackaged cereal with added sugar. Vegetables: Deep fried potatoes (french fries). Fruits: Fruit canned in syrup. Meats and other protein foods: Beef. Pork. Lamb. Poultry with skin. Hot dogs. [place]. Dairy: Ice cream. Sour cream. Whole milk. Beverages: Juice. Sugar-sweetened soft drinks. Beer. Liquor and spirits. Fats and oils: Butter. Canola oil. Vegetable oil. Beef fat (tallow). Lard. Sweets and desserts: Cookies. Cakes. Pies.  Candy. Seasoning and other foods: Mayonnaise. Premade sauces and marinades. The items listed may not be a complete list. Talk with your dietitian about what dietary choices are right for you. Summary The Mediterranean diet includes both food and lifestyle choices. Eat a variety of fresh fruits and vegetables, beans, nuts, seeds, and whole grains. Limit the amount of red meat and sweets that you eat. Talk with your health care provider about whether it is safe for you to drink red wine in moderation. This means 1 glass a day for  nonpregnant women and 2 glasses a day for men. A glass of wine equals 5 oz (150 mL). This information is not intended to replace advice given to you by your health care provider. Make sure you discuss any questions you have with your health care provider. Document Released: 02/15/2016 Document Revised: 03/19/2016 Document Reviewed: 02/15/2016 Elsevier Interactive Patient Education  2017 [employer].

## 2024-01-15 ENCOUNTER — Ambulatory Visit: Payer: Self-pay

## 2024-01-15 NOTE — Telephone Encounter (Signed)
 Copied from CRM [id number]. Topic: Clinical - Medical Advice >> Jan 15, 2024  5:04 PM Winona R wrote: Risk Management NP would like to report her findings to a clinical staff member.

## 2024-01-16 NOTE — Telephone Encounter (Signed)
 Needs go to ED now

## 2024-01-16 NOTE — Telephone Encounter (Signed)
 Received call from Shawnee Nay with [facility] [facility]. She was doing a Home health Risk Assessment for AWV/Medicare and noticed that patient has orthostatic BP, and is symptomatic, dizzy when standing.   94/68 Sitting 88/61 Standing  Patient informed her that he does not take all of his medications but he does take some but was not clear what he actually takes.   Patient needs to be setup for appointment to discuss medication changes/orthostatic systems.

## 2024-01-19 NOTE — Telephone Encounter (Signed)
 LVM for pt to return call

## 2024-01-20 ENCOUNTER — Telehealth: Payer: Self-pay

## 2024-01-20 NOTE — Telephone Encounter (Unsigned)
 Copied from CRM [id number]. Topic: General - Call Back - No Documentation >> Jan 19, 2024  5:08 PM Shereese L wrote: Reason for CRM: patient called back after hours and returning a call to the office... LVM for pt to return call.

## 2024-01-21 NOTE — Telephone Encounter (Signed)
 Called and informed patient of advise from PCP. Patient expressed they wanted to check blood pressure again to see if it is consistently low. Patient looking around for cuff now and will call back with readings. Patient aware of current cardiac issues and importance of getting blood pressure checked

## 2024-01-21 NOTE — Telephone Encounter (Signed)
 Called and was not able to reach out to patient, left voice message for call back

## 2024-01-22 ENCOUNTER — Ambulatory Visit: Attending: Physician Assistant

## 2024-01-22 VITALS — BP 134/68 | HR 62

## 2024-01-22 DIAGNOSIS — R42 Dizziness and giddiness: Secondary | ICD-10-CM | POA: Diagnosis not present

## 2024-01-22 DIAGNOSIS — R4189 Other symptoms and signs involving cognitive functions and awareness: Secondary | ICD-10-CM | POA: Insufficient documentation

## 2024-01-22 DIAGNOSIS — M6281 Muscle weakness (generalized): Secondary | ICD-10-CM | POA: Insufficient documentation

## 2024-01-22 DIAGNOSIS — R262 Difficulty in walking, not elsewhere classified: Secondary | ICD-10-CM | POA: Insufficient documentation

## 2024-01-22 DIAGNOSIS — R413 Other amnesia: Secondary | ICD-10-CM | POA: Insufficient documentation

## 2024-01-22 NOTE — Therapy (Addendum)
 OUTPATIENT PHYSICAL THERAPY VESTIBULAR EVALUATION     Patient Name: Seth Bryant MRN: [id number] DOB:[birth year], 78 y.o., male Today's Date: 01/22/2024  END OF SESSION:  PT End of Session - 01/22/24 1353     Visit Number 1    Number of Visits 4    Date for PT Re-Evaluation 08/22/23    Authorization Type [employer]    PT Start Time 1400    PT Stop Time 1446    PT Time Calculation (min) 46 min    Equipment Utilized During Treatment Gait belt    Activity Tolerance Patient tolerated treatment well    Behavior During Therapy WFL for tasks assessed/performed          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   14Mar2022: Patient continues with alprazolam  1 mg he takes 1 at night-pt counselled re the safety concern with  this medicaion use in combination with ETOHa nd pain meds- pt and wife aware that high risk with these meds for injury- fall, addiction, and even possible death from respiratory supression.- new contract signed by pt with wife present; Impression - 26May2022: Patient continu   Cervical spondylosis 11/26/2006   Chronic pain 09/27/2016   Digital nerve laceration, finger 11/12/2017   Diverticulosis of colon 10/09/2015   Drug-induced photosensitivity 09/23/2019   Elevated PSA 03/04/2019   26May2022: Patient's PSA is up to 8-pending surgical procedure for prostate biopsies with Dr. Donah today's visit is for preop clearance   Fatigue 10/14/2008   01May2023: Fatigue likely associated with low testosterone levels recent treatment of prostate cancer increasing age decreasing activity levels   Flexor tendon rupture of hand 11/12/2017   Frequent falls 04/25/2022    19Oct2023: The etiology of the falls is unclear but per the wife likely associated with alcohol intake and patient losing his footing-there is been no loss of consciousness-ER evaluation with head CT scan was negative counseled about drinking and using an assistive walking device could be helpful   Generalized anxiety disorder 06/05/2023   Insomnia 04/01/2014   Internal hemorrhoid 10/09/2015   Lateral epicondylitis 06/14/2009   Lumbar spondylosis 11/04/2005   01May2023: Chronic ongoing lower back pain intermittent patient uses Vicodin and/or Tylenol  sometimes nonsteroidal anti-inflammatories for discomfort   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   Multiple lacunar infarcts    Neck pain 11/26/2006   Neoplasm of uncertain behavior of skin 07/18/2014   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   14Mar2022: as above- pt taking hydrocodone  prn- not taking every day; Impression - 09Feb2023: Patient taking not taking every day cures done today no aberrant behavior; Impression - 01May2023: Patient taking not taking every day cures done today no aberrant behavior-strongly encourage patient to wean off of this medication Tylenol  only as needed new contract has been established   Polymyalgia rheumatica 06/01/2008   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   Senile purpura 12/19/2022   Skin cancer 03/07/2008   Somatic dysfunction of cervical region 11/26/2006   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   Thrombocytopenia 12/19/2022   Thyroid  nodule 06/05/2023   Vitamin B12 deficiency 03/31/2023   Past Surgical History:  Procedure Laterality Date   ANKLE SURGERY     BACK SURGERY     KNEE SURGERY  SHOULDER SURGERY     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   Multiple lacunar infarcts    Thyroid  nodule 06/05/2023   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    Generalized anxiety disorder 06/05/2023   Vitamin B12 deficiency 03/31/2023   Physician orders for life-sustaining treatment (POLST) form indicates patient wish for do-not-resuscitate status 12/19/2022   Senile purpura 12/19/2022   Thrombocytopenia 12/19/2022   Driving safety issue 96/77/7975   Frequent falls 04/25/2022   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   Drug-induced photosensitivity 09/23/2019   Elevated PSA 03/04/2019   Digital nerve laceration, finger 11/12/2017   Chronic pain 09/27/2016   Diverticulosis of colon 10/09/2015   Internal hemorrhoid 10/09/2015   Age-related nuclear cataract of both eyes 11/10/2014   Alcoholism 11/07/2014   Insomnia 04/01/2014   Lateral epicondylitis 06/14/2009   Fatigue 10/14/2008   Polymyalgia rheumatica 06/01/2008   Benign neoplasm of large intestine 05/23/2008   Skin cancer 03/07/2008   Actinic keratosis 03/07/2008   Arthralgia of multiple sites 01/28/2008   Cervical spondylosis 11/26/2006   Neck pain 11/26/2006   Somatic dysfunction of cervical region 11/26/2006   Lumbar spondylosis 11/04/2005   Anesthesia of skin 09/19/2005    PCP: Norleen Lynwood ORN, MD  REFERRING PROVIDER: Dina Camie BRAVO, PA-C  REFERRING DIAG:  R41.89 (ICD-10-CM) - Cognitive deficits  R41.3 (ICD-10-CM) - Memory impairment   THERAPY DIAG:  Dizziness and giddiness  Difficulty in walking, not elsewhere classified  Muscle weakness (generalized)  ONSET DATE: 01/22/24 - referral date  Rationale for Evaluation and Treatment: Rehabilitation  SUBJECTIVE:   SUBJECTIVE STATEMENT: Pt presents to PT initial evaluation with no AD and Seth Bryant present. Pt states he no longer drives long distances but will occasionally drive short distances (1-2 miles). Pt describes dizziness as things spinning which got significantly worse ~6 months ago; pt unable to determine any  specific mechanism. Pt reports the dizziness gets better when laying down in bed and worsens when sitting to standing up from bed.    Pt accompanied by: self and Seth Bryant (78 y.o.)  PERTINENT HISTORY: Mild dementia of unclear etiology concern for Alzheimer's disease, prior alcohol abuse and dependence, generalized anxiety   PAIN:  Are you having pain? Yes: NPRS scale: 3/10 Pain location: LBP, pt pointing to where back surgery was Pain description: dull, achy Aggravating factors: always there but intensifies with lifting heavy object Relieving factors: topical cream, rest  PRECAUTIONS: None  RED FLAGS: None   WEIGHT BEARING RESTRICTIONS: No  FALLS: Has patient fallen in last 6 months? Yes. Number of falls 2; getting out of bed at night  LIVING ENVIRONMENT: Lives with: Seth Bryant and son Lives in: House/apartment Stairs: Yes: External: 6 steps; on right going up Has following equipment at home: None  PLOF: Independent with basic ADLs; Seth Bryant oversees medication management  PATIENT GOALS: to not be dizzy I guess!  OBJECTIVE:  Note: Objective measures were completed at Evaluation unless otherwise noted.  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 Last MRI of the brain on December 2024, showed mild chronic small vessel ischemic changes within the cerebral white matter, chronic lacunar infarcts within the left basal ganglia as well as mild to moderate generalized cerebral atrophy with mild cerebellar atrophy, without acute findings.  COGNITION: Overall cognitive status: Within functional limits for tasks assessed             SENSATION: WFL   Cervical ROM:  grossly limited      STRENGTH:  WFL    VESTIBULAR ASSESSMENT:  GENERAL OBSERVATION: presents to PT initial evaluation using no AD, no signs of distress or apprehension; hyperactive/restless throughout initial evaluation   SYMPTOM BEHAVIOR:  Subjective history: see above  Non-Vestibular symptoms: changes in vision and  pt reports d/t age-related changes  Type of dizziness: Imbalance (Disequilibrium) and Spinning/Vertigo  Frequency: daily  Duration: gets better throughout the day  Aggravating factors: Induced by position change: sit to stand  Relieving factors: lying supine  Progression of symptoms: worse  OCULOMOTOR EXAM:  Ocular Alignment: normal and pt reports feeling like things are floating  Ocular ROM: No Limitations  Spontaneous Nystagmus: absent  Gaze-Induced Nystagmus: absent  Smooth Pursuits: intact  Saccades: intact  Convergence/Divergence: 17 cm   VESTIBULAR - OCULAR REFLEX:   Slow VOR: Normal  VOR Cancellation: Normal  Head-Impulse Test: HIT Right: negative HIT Left: negative  Dynamic Visual Acuity: deferred to next session   POSITIONAL TESTING: deferred to next session  MOTION SENSITIVITY:  Motion Sensitivity Quotient Intensity: 0 = none, 1 = Lightheaded, 2 = Mild, 3 = Moderate, 4 = Severe, 5 = Vomiting  Intensity  1. Sitting to supine   2. Supine to L side   3. Supine to R side   4. Supine to sitting   5. L Hallpike-Dix   6. Up from L    7. R Hallpike-Dix   8. Up from R    9. Sitting, head tipped to L knee   10. Head up from L knee   11. Sitting, head tipped to R knee   12. Head up from R knee   13. Sitting head turns x5   14.Sitting head nods x5   15. In stance, 180 turn to L    16. In stance, 180 turn to R     OTHOSTATICS:  Seated:  BP - 141/61 Pulse rate - 58  Standing:  BP - 134/68 Pulse rate - 62                                                                                                                             TREATMENT DATE: 01/22/24 Patient Education / Self-Care: Today's Vitals   01/22/24 1432 01/22/24 1435  BP: (!) 141/61 134/68  Pulse: (!) 58 62  *sitting; standing *Pt reported a mild increase in dizziness upon standing that subsided after ~1 minute Education on the pathophysiology of BPPV and the role PT plays in the treatment and  prevention future episodes Education on orthostatic hypotension and how it is likely contributing to reported dizziness  Pt's Seth Bryant (caregiver) plans to assist pt in daily, morning BP monitoring   Discussion surrounding following up with optometrist about self-reported decline in vision  Pt verbalized understanding  and agreeable as he stated he hadn't been to the optometrist in >2 years PT suspects visual deficits are contributing to dizziness as well  Canalith Repositioning: deferred to next session   Gaze Adaptation: deferred to next session   Habituation: deferred to next session  PATIENT EDUCATION: Education details: see above, clinical findings, POC, BP monitoring administered through caregiver upon waking in the morning (sitting at edge of bed, standing at edge of bed)  Person educated: Patient and Caregiver Seth Bryant Education method: Explanation Education comprehension: verbalized understanding and needs further education  HOME EXERCISE PROGRAM:  GOALS: Goals reviewed with patient? Yes  SHORT TERM GOALS: All STGs = LTGs    LONG TERM GOALS: Target date: 02/20/24  Pt will be independent and compliant with final HEP in order to maintain functional progress and improve mobility.  Baseline:  Goal status: INITIAL   2.  DVA goal to be written once assessed. Baseline:  Goal status: INITIAL   3.  Patient will demonstrate less than or equal to 8cm for convergence test to demonstrate improvement in binocular vision. Baseline: 17cm Goal status: INITIAL  ASSESSMENT:  CLINICAL IMPRESSION: Pt is a 78 year old male referred to Neuro OPPT for vertigo with cognitive deficits and memory impairment. Pt's PMH is significant for: mild dementia of unclear etiology concern for Alzheimer's disease, prior alcohol abuse and dependence, generalized anxiety. Pt presented to PT initial evaluation in a rushed manner and hyperactivity/restlessness ensued as the initial evaluation continued. Pt  reported decline in vision which has not been addressed by an optometrist in >2 years. Pt additionally reported the sole aggravating factor for dizziness symptoms is when he stands up from bed, having to take ~1 minute to get his bearings and feel the world stop spinning. PT suspects the aforementioned pt reports, coupled with the orthostatic hypotension are more likely contributing to the pt's dizziness; testing for BPPV will be done next session to effectively rule it out. Pt did exhibit 17cm on convergence testing; although this is a very mild deficit, PT could trial exercises such as brock string, pencil pushup, and staw in cup exercise to strengthen binocular vision. The following deficits were present during the exam: orthostatic hypotension, limited cervical ROM, guarding with head movements, generalized anxiety, and mild dementia. Based on dizziness and current functional status, pt falls below self-reported baseline (prior to 6 months ago), pt is at an increased risk for future falls. Pt would benefit from skilled PT to address these impairments and functional limitations to return to PLOF and improve QoL.    OBJECTIVE IMPAIRMENTS: decreased balance, decreased cognition, decreased coordination, decreased knowledge of condition, difficulty walking, decreased safety awareness, dizziness, and pain.   ACTIVITY LIMITATIONS: isolated to sit>stand transfer  PARTICIPATION LIMITATIONS: interpersonal relationship, driving, shopping, and community activity  PERSONAL FACTORS: Age, Behavior pattern, Past/current experiences, Time since onset of injury/illness/exacerbation, and 3+ comorbidities: generalized anxiety, mixed dementia, alcoholism are also affecting patient's functional outcome.   REHAB POTENTIAL: Fair d/t chronicity, lack of insight into deficits, poor healthcare literacy   CLINICAL DECISION MAKING: Evolving/moderate complexity  EVALUATION COMPLEXITY: Moderate   PLAN:  PT FREQUENCY:  1x/week  PT DURATION: 4 weeks  PLANNED INTERVENTIONS: 97164- PT Re-evaluation, 97750- Physical Performance Testing, 97110-Therapeutic exercises, 97530- Therapeutic activity, [id number]- Neuromuscular re-education, 97535- Self Care, 02859- Manual therapy, [phone]- Gait training, [phone]- Canalith repositioning, Patient/Family education, Balance training, Stair training, Vestibular training, Visual/preceptual remediation/compensation, Cognitive remediation, DME instructions, Cryotherapy, and Moist heat  PLAN FOR NEXT SESSION: check for orthostatics!, follow-up about  home sitting/standing BP readings, scheduled follow-up with optometrist?, positional testing and DVA (if indicated); if vestibular system is cleared - transition into looking at potential balance deficits or refer out as appropriate    Waddell Nailer, Student-PT Delon DELENA Pop, PT, DPT, CBIS  01/22/2024, 3:55 PM

## 2024-01-22 NOTE — Addendum Note (Signed)
 Addended by: BEAUTY NEST A on: 01/22/2024 04:00 PM   Modules accepted: Orders

## 2024-01-26 DIAGNOSIS — R3121 Asymptomatic microscopic hematuria: Secondary | ICD-10-CM | POA: Diagnosis not present

## 2024-01-27 ENCOUNTER — Telehealth: Payer: Self-pay

## 2024-01-27 ENCOUNTER — Encounter: Payer: Self-pay | Admitting: Internal Medicine

## 2024-01-27 NOTE — Telephone Encounter (Signed)
 Copied from CRM [id number]. Topic: Medical Record Request - Other >> Jan 27, 2024  8:44 AM Jasmin G wrote: Reason for CRM: Pt. Recently got based in [place]  and will be leaving today, he has been treated for dementia and is requesting a form from Dr. For him not to present himself in court on the 24th an is also requesting a list of medications he has taken for treat his dementia. Please give pt. A call back ASAP, he sis state this is an urgent matter as he is leaving the state soon. >> Jan 27, 2024  9:16 AM Henretta I wrote: Patient called back saying he needs letter stating he was be treated for demetia and the pills he was taking. Patient has a case in 2 days. Needs to get this letter to his public defender before the 24th. If you guys are able to send it, patient has provider the information for his public defender.  Logan rand  [address] floor  [place] [address] Case: [id number] Phone : [phone]

## 2024-01-27 NOTE — Telephone Encounter (Unsigned)
 Copied from CRM [id number]. Topic: Medical Record Request - Other >> Jan 27, 2024  8:44 AM Jasmin G wrote: Reason for CRM: Pt. Recently got based in [place]  and will be leaving today, he has been treated for dementia and is requesting a form from Dr. For him not to present himself in court on the 24th an is also requesting a list of medications he has taken for treat his dementia. Please give pt. A call back ASAP, he sis state this is an urgent matter as he is leaving the state soon. >> Jan 27, 2024  2:47 PM Rosina D wrote: Patient called stating he want to give the fax number to the office of Leontine Rivers and he want to see if the office can also give the fax number to the neurologist sara workman Public [phone] Fax [phone] Case#[id number] CB for patient-336 [phone] >> Jan 27, 2024  9:16 AM Henretta I wrote: Patient called back saying he needs letter stating he was be treated for demetia and the pills he was taking. Patient has a case in 2 days. Needs to get this letter to his public defender before the 24th. If you guys are able to send it, patient has provider the information for his public defender.  Logan rand  [address]  [place] [address] Case: [id number] Phone : [phone]

## 2024-01-27 NOTE — Telephone Encounter (Signed)
 Ok note sent on [employer] also done hardcopy to [facility]

## 2024-01-28 ENCOUNTER — Encounter: Payer: Self-pay | Admitting: Physician Assistant

## 2024-01-28 ENCOUNTER — Telehealth: Payer: Self-pay | Admitting: Internal Medicine

## 2024-01-28 ENCOUNTER — Telehealth: Payer: Self-pay | Admitting: Physician Assistant

## 2024-01-28 NOTE — Progress Notes (Signed)
     Physician's Statement for Medical Excuse  To [employer]:  ___x____ Permanent Excuse from [employer]  Please excuse the above named patient from federal jury duty due to cognitive impairment. She is a patient in the Neurology clinic for memory loss. He has a diagnosis  dementia likely due to Alzheimer's disease It is medically advisable that the patient refrain from this type of service.    For any questions or concerns, please do not hesitate to call our office.   Sincerely,   Camie Sevin, PA-C

## 2024-01-28 NOTE — Telephone Encounter (Signed)
 Patient is aware, will check on this with Camie in the am

## 2024-01-28 NOTE — Telephone Encounter (Signed)
 Called patient and informed him that we have unfortunately been receiving busy responses back from fax number provided. Informed him that this has been consistent for the last 5-6 attempts. I attempted to call number provided for public defender Leontine Rivers it went to voice mail. Provided a call back number so that we could procure a new fax number to send this to

## 2024-01-28 NOTE — Telephone Encounter (Signed)
 Pt is still waiting on an update.  Thanks

## 2024-01-28 NOTE — Telephone Encounter (Signed)
 Pt called back in and left a message wanting to see if the letter had been faxed yet.

## 2024-01-28 NOTE — Telephone Encounter (Signed)
 Pt. has court in [place]  tomorrow and needs letter of Diagnosis from Camie Sevin, you can fax it to [phone]

## 2024-01-29 NOTE — Telephone Encounter (Signed)
 Called and spoke with [facility]. Informed him of the busy notes on our end. He had spoke with his receptionist which had noted that they had been having fax issues. He had provided me with a separate fax number : [phone]

## 2024-01-29 NOTE — Telephone Encounter (Unsigned)
 Copied from CRM [id number]. Topic: Medical Record Request - Other >> Jan 29, 2024  1:58 PM Rosina BIRCH wrote: Leontine Rivers from the public defender office in [place] is returning a call to zackary Fax-[phone] CB [phone]

## 2024-01-29 NOTE — Telephone Encounter (Signed)
 error

## 2024-01-30 ENCOUNTER — Ambulatory Visit

## 2024-01-30 VITALS — BP 130/61 | HR 52

## 2024-01-30 DIAGNOSIS — M6281 Muscle weakness (generalized): Secondary | ICD-10-CM

## 2024-01-30 DIAGNOSIS — R262 Difficulty in walking, not elsewhere classified: Secondary | ICD-10-CM | POA: Diagnosis not present

## 2024-01-30 DIAGNOSIS — R42 Dizziness and giddiness: Secondary | ICD-10-CM

## 2024-01-30 DIAGNOSIS — R413 Other amnesia: Secondary | ICD-10-CM | POA: Diagnosis not present

## 2024-01-30 NOTE — Therapy (Signed)
 OUTPATIENT PHYSICAL THERAPY VESTIBULAR TREATMENT/ DISCHARGE SUMMARY     Patient Name: Seth Bryant MRN: [id number] DOB:1946-04-13, 78 y.o., male Today's Date: 01/30/2024  PHYSICAL THERAPY DISCHARGE SUMMARY  Visits from Start of Care: 2  Current functional level related to goals / functional outcomes: See below   Remaining deficits: Poor insight into deficits, impaired balance, constant dizziness unchanged with positional changes or movement, fall risk    Education / Equipment: PT POC, exam findings, impact of polypharmacy on dizziness/balance, use of AD (cane vs RW) AAT for safety   Patient agrees to discharge. Patient goals were not met. Patient is being discharged due to maximized rehab potential.   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   14Mar2022: Patient continues with alprazolam  1 mg he takes 1 at night-pt counselled re the safety concern with  this medicaion use in combination with ETOHa nd pain meds- pt and wife aware that high risk with these meds for injury- fall, addiction, and even possible death from respiratory supression.- new contract signed by pt with wife present; Impression - 26May2022: Patient continu   Cervical spondylosis 11/26/2006   Chronic pain 09/27/2016   Digital nerve laceration, finger  11/12/2017   Diverticulosis of colon 10/09/2015   Drug-induced photosensitivity 09/23/2019   Elevated PSA 03/04/2019   26May2022: Patient's PSA is up to 8-pending surgical procedure for prostate biopsies with Dr. Donah today's visit is for preop clearance   Fatigue 10/14/2008   01May2023: Fatigue likely associated with low testosterone levels recent treatment of prostate cancer increasing age decreasing activity levels   Flexor tendon rupture of hand 11/12/2017   Frequent falls 04/25/2022   19Oct2023: The etiology of the falls is unclear but per the wife likely associated with alcohol intake and patient losing his footing-there is been no loss of consciousness-ER evaluation with head CT scan was negative counseled about drinking and using an assistive walking device could be helpful   Generalized anxiety disorder 06/05/2023   Insomnia 04/01/2014   Internal hemorrhoid 10/09/2015   Lateral epicondylitis 06/14/2009   Lumbar spondylosis 11/04/2005   01May2023: Chronic ongoing lower back pain intermittent patient uses Vicodin and/or Tylenol  sometimes nonsteroidal anti-inflammatories for discomfort   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   Multiple lacunar infarcts    Neck pain 11/26/2006   Neoplasm of uncertain behavior of skin 07/18/2014   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   14Mar2022: as above- pt taking hydrocodone  prn- not taking every day; Impression - 09Feb2023: Patient taking not taking every day cures done today no aberrant behavior; Impression - 01May2023: Patient taking not taking every day cures done today no aberrant behavior-strongly encourage patient to wean off of this medication Tylenol  only as needed new contract has been established  Polymyalgia rheumatica 06/01/2008   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   Senile purpura 12/19/2022   Skin cancer 03/07/2008   Somatic dysfunction of cervical region 11/26/2006   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   Thrombocytopenia 12/19/2022   Thyroid   nodule 06/05/2023   Vitamin B12 deficiency 03/31/2023   Past Surgical History:  Procedure Laterality Date   ANKLE SURGERY     BACK SURGERY     KNEE SURGERY     SHOULDER SURGERY     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   Multiple lacunar infarcts    Thyroid  nodule 06/05/2023   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   Generalized anxiety disorder 06/05/2023   Vitamin B12 deficiency 03/31/2023   Physician orders for life-sustaining treatment (POLST) form indicates patient wish for do-not-resuscitate status 12/19/2022   Senile purpura 12/19/2022   Thrombocytopenia 12/19/2022   Driving safety issue 96/77/7975   Frequent falls 04/25/2022   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   Drug-induced photosensitivity 09/23/2019   Elevated PSA 03/04/2019   Digital nerve laceration, finger 11/12/2017   Chronic pain 09/27/2016   Diverticulosis of colon 10/09/2015   Internal hemorrhoid 10/09/2015   Age-related nuclear cataract of both eyes 11/10/2014   Alcoholism 11/07/2014   Insomnia 04/01/2014   Lateral epicondylitis 06/14/2009   Fatigue 10/14/2008   Polymyalgia rheumatica 06/01/2008   Benign neoplasm of large intestine 05/23/2008   Skin cancer 03/07/2008   Actinic keratosis 03/07/2008   Arthralgia of multiple sites 01/28/2008   Cervical spondylosis 11/26/2006   Neck pain 11/26/2006   Somatic dysfunction of cervical region 11/26/2006   Lumbar spondylosis 11/04/2005   Anesthesia of skin 09/19/2005    PCP: Norleen Lynwood ORN, MD  REFERRING PROVIDER: Dina Camie BRAVO, PA-C  REFERRING DIAG:  R41.89 (ICD-10-CM) - Cognitive deficits  R41.3 (ICD-10-CM) - Memory impairment   THERAPY DIAG:  Dizziness and  giddiness  Difficulty in walking, not elsewhere classified  Muscle weakness (generalized)  ONSET DATE: 01/22/24 - referral date  Rationale for Evaluation and Treatment: Rehabilitation  SUBJECTIVE:   SUBJECTIVE STATEMENT: Patient initially states I'm doing terrible. Reports dizziness is now getting worse and worse every day. States he had a heart murmur 40-50 years ago and someone [facility] nurse?) came to his house and listened to his heart and stated he still had a heart murmur. He states he takes a Xanax  and 2 other sleeping pills (describing Tylenol  PM?) every night. Denies falls, but reports multiple near misses when walking in his hallway at ome.   Pt accompanied by: self and mother (42 y.o.)  PERTINENT HISTORY: Mild dementia of unclear etiology concern for Alzheimer's disease, prior alcohol abuse and dependence, generalized anxiety   PAIN:  Are you having pain? Yes: NPRS scale: 3/10 Pain location: LBP, pt pointing to where back surgery was Pain description: dull, achy Aggravating factors: always there but intensifies with lifting heavy object Relieving factors: topical cream, rest  PRECAUTIONS: Fall   PATIENT GOALS: to not be dizzy I guess!  OBJECTIVE:  Note: Objective measures were completed at Evaluation unless otherwise noted.  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 Last MRI of the brain on December 2024, showed mild chronic small vessel ischemic changes within the cerebral white matter, chronic lacunar infarcts within the left  basal ganglia as well as mild to moderate generalized cerebral atrophy with mild cerebellar atrophy, without acute findings.    VESTIBULAR ASSESSMENT:  GENERAL OBSERVATION: presents to PT initial evaluation using no AD, no signs of distress or apprehension; hyperactive/restless throughout initial evaluation   SYMPTOM BEHAVIOR:  Subjective history: see above  Non-Vestibular symptoms: changes in vision and pt reports d/t age-related  changes  Type of dizziness: Imbalance (Disequilibrium) and Spinning/Vertigo  Frequency: daily  Duration: gets better throughout the day  Aggravating factors: Induced by position change: sit to stand  Relieving factors: lying supine  Progression of symptoms: worse  OCULOMOTOR EXAM:  Ocular Alignment: normal and pt reports feeling like things are floating  Ocular ROM: No Limitations  Spontaneous Nystagmus: absent  Gaze-Induced Nystagmus: absent  Smooth Pursuits: intact  Saccades: intact  Convergence/Divergence: 17 cm   VESTIBULAR - OCULAR REFLEX:   Slow VOR: Normal  VOR Cancellation: Normal  Head-Impulse Test: HIT Right: negative HIT Left: negative     POSITIONAL TESTING:  Roll test  -R:(-)  -L:(-) Sidelying test  -R:(-)  -L:(-)   MOTION SENSITIVITY:  Motion Sensitivity Quotient Intensity: 0 = none, 1 = Lightheaded, 2 = Mild, 3 = Moderate, 4 = Severe, 5 = Vomiting  Intensity  1. Sitting to supine 2  2. Supine to L side 2  3. Supine to R side 2  4. Supine to sitting 2  5. L sidelying 2  6. Up from L  2  7. R sidelying 2  8. Up from R  2  9. Sitting, head tipped to L knee 2  10. Head up from L knee 2  11. Sitting, head tipped to R knee 2  12. Head up from R knee 2  13. Sitting head turns x5 2  14.Sitting head nods x5 2  15. In stance, 180 turn to L    16. In stance, 180 turn to R     OTHOSTATICS:  Vitals:   01/30/24 1150 01/30/24 1151  BP: 123/62 130/61  Pulse: (!) 53 (!) 52                                                                                                                                TREATMENT DATE: 01/22/24 Patient Education / Self-Care: Today's Vitals   01/30/24 1147 01/30/24 1150 01/30/24 1151  BP: 119/64 123/62 130/61  Pulse: (!) 53 (!) 53 (!) 52   *sitting; standing -completed vestibular assessment (see above)  -education on moving slow to allow for accommodation in BP and mitigate fall risk  -education on use of AD to  minimize fall risk and slow down movements as well   PATIENT EDUCATION: Education details: see above, clinical findings, POC Person educated: Patient and Caregiver mother Education method: Explanation Education comprehension: verbalized understanding and needs further education  HOME EXERCISE PROGRAM:  GOALS: Goals reviewed with patient? Yes  SHORT TERM GOALS: All STGs = LTGs  LONG TERM GOALS: Target date: 02/20/24  Pt will be independent and compliant with final HEP in order to maintain functional progress and improve mobility.  Baseline:  Goal status: INITIAL   2.  DVA goal to be written once assessed. Baseline:  Goal status: INITIAL   3.  Patient will demonstrate less than or equal to 8cm for convergence test to demonstrate improvement in binocular vision. Baseline: 17cm Goal status: INITIAL  ASSESSMENT:  CLINICAL IMPRESSION: Patient seen for skilled PT session with emphasis on completing vestibular assessment and patient education. His exam is reflective of generalized dizziness/motion sensitivity that is not eased or made worse with any position changes or movement. All positional testing with negative, therefore effectively ruling out BPPV as an etiology for his dizziness. The remainder of his vestibular exam was normal as well. Given his PMH and current medications, coupled with noted OTC medications he's taking nightly, PT with strong suspicion for polypharmacy causing his dizziness. His impulsivity, intermittently labile BP and polypharmacy all contribute to his generalized instability as well. Efforts toward impacting his balance would be fruitless so long as his dizziness persists and his dizziness is not vestibular in origin, thus skilled PT services are not needed at this time. Patient resistant to education on use of AD AAT; however, it remains this PT's professional recommendation to use a cane or RW to slow him down and minimize his risk for falls. Patient agreeable  to dc at this time.   OBJECTIVE IMPAIRMENTS: decreased balance, decreased cognition, decreased coordination, decreased knowledge of condition, difficulty walking, decreased safety awareness, dizziness, and pain.   ACTIVITY LIMITATIONS: isolated to sit>stand transfer  PARTICIPATION LIMITATIONS: interpersonal relationship, driving, shopping, and community activity  PERSONAL FACTORS: Age, Behavior pattern, Past/current experiences, Time since onset of injury/illness/exacerbation, and 3+ comorbidities: generalized anxiety, mixed dementia, alcoholism are also affecting patient's functional outcome.   REHAB POTENTIAL: Fair d/t chronicity, lack of insight into deficits, poor healthcare literacy   CLINICAL DECISION MAKING: Evolving/moderate complexity  EVALUATION COMPLEXITY: Moderate   PLAN:  PT FREQUENCY: 1x/week  PT DURATION: 4 weeks  PLANNED INTERVENTIONS: 97164- PT Re-evaluation, 97750- Physical Performance Testing, 97110-Therapeutic exercises, 97530- Therapeutic activity, 97112- Neuromuscular re-education, 97535- Self Care, 02859- Manual therapy, [phone]- Gait training, [phone]- Canalith repositioning, Patient/Family education, Balance training, Stair training, Vestibular training, Visual/preceptual remediation/compensation, Cognitive remediation, DME instructions, Cryotherapy, and Moist heat  PLAN FOR NEXT SESSION: dc from PT   Delon DELENA Pop, PT Delon DELENA Pop, PT, DPT, CBIS  01/30/2024, 12:40 PM

## 2024-02-02 ENCOUNTER — Ambulatory Visit: Admitting: Physical Therapy

## 2024-02-13 ENCOUNTER — Encounter

## 2024-02-20 ENCOUNTER — Encounter

## 2024-03-12 ENCOUNTER — Ambulatory Visit: Admitting: Physician Assistant

## 2024-03-16 ENCOUNTER — Telehealth: Payer: Self-pay | Admitting: Physician Assistant

## 2024-03-16 ENCOUNTER — Telehealth: Payer: Self-pay | Admitting: Internal Medicine

## 2024-03-16 NOTE — Telephone Encounter (Signed)
 PT states they require a letter/statement regarding his health  condition be formulated and sent to his  Public Defender:  [address],  [place], [place]  [address].  FAX: No. [phone] Documentation received at front desk included in providers box.

## 2024-03-16 NOTE — Telephone Encounter (Signed)
 PT states they need a

## 2024-03-16 NOTE — Telephone Encounter (Signed)
 Seth Bryant. Arts administrator, Seth Bryant and has asked for a statement from his physican regarding his health condition. I told him that I have been diagnosed with dementia, but he needs a statement to that effect. Fax number [phone].

## 2024-03-17 ENCOUNTER — Encounter: Payer: Self-pay | Admitting: Neurology

## 2024-03-17 NOTE — Telephone Encounter (Signed)
-  Letter was done by Addie Shivers, I faxed-and received confirmation of the letter faxed at [phone]. Nofited patient that this was taken care of, thanked us  for helping.

## 2024-03-17 NOTE — Telephone Encounter (Signed)
 Patient was notified and faxed.

## 2024-03-17 NOTE — Telephone Encounter (Signed)
 Done, thanks

## 2024-03-19 ENCOUNTER — Encounter: Payer: Self-pay | Admitting: Internal Medicine

## 2024-03-29 DIAGNOSIS — R3121 Asymptomatic microscopic hematuria: Secondary | ICD-10-CM | POA: Diagnosis not present

## 2024-05-11 ENCOUNTER — Other Ambulatory Visit: Payer: Self-pay | Admitting: Internal Medicine

## 2024-05-17 ENCOUNTER — Institutional Professional Consult (permissible substitution): Payer: Medicare Other | Admitting: Psychology

## 2024-05-17 ENCOUNTER — Ambulatory Visit: Payer: Self-pay

## 2024-05-19 ENCOUNTER — Telehealth: Payer: Self-pay | Admitting: Internal Medicine

## 2024-05-19 DIAGNOSIS — M47816 Spondylosis without myelopathy or radiculopathy, lumbar region: Secondary | ICD-10-CM

## 2024-05-19 DIAGNOSIS — M48061 Spinal stenosis, lumbar region without neurogenic claudication: Secondary | ICD-10-CM

## 2024-05-19 NOTE — Telephone Encounter (Signed)
 Called and let Pt know

## 2024-05-19 NOTE — Telephone Encounter (Signed)
 Patient needs updated referral to [facility] for worsening lower back pain. Was referred last year and back pain was discussed at OV on 12/04/23. Please call patient when complete: [phone]

## 2024-05-19 NOTE — Telephone Encounter (Signed)
Ok this is done erx 

## 2024-05-20 ENCOUNTER — Ambulatory Visit: Admitting: Physician Assistant

## 2024-05-20 ENCOUNTER — Telehealth: Payer: Self-pay

## 2024-05-20 NOTE — Telephone Encounter (Signed)
 Copied from CRM [id number]. Topic: General - Other >> May 19, 2024  4:21 PM Joesph B wrote: Reason for CRM: Leontine [facility] smiths attorney) is calling to ask Dr.Odell more info in regards to his diagnosis. Asking Dr.Isam or his nurse to give him a call.   [phone].

## 2024-05-27 ENCOUNTER — Encounter: Payer: Medicare Other | Admitting: Psychology

## 2024-05-28 ENCOUNTER — Ambulatory Visit: Admitting: Physical Medicine and Rehabilitation

## 2024-05-28 ENCOUNTER — Encounter: Payer: Self-pay | Admitting: Physical Medicine and Rehabilitation

## 2024-05-28 ENCOUNTER — Other Ambulatory Visit (INDEPENDENT_AMBULATORY_CARE_PROVIDER_SITE_OTHER): Payer: Self-pay

## 2024-05-28 DIAGNOSIS — M5416 Radiculopathy, lumbar region: Secondary | ICD-10-CM | POA: Diagnosis not present

## 2024-05-28 DIAGNOSIS — M5441 Lumbago with sciatica, right side: Secondary | ICD-10-CM

## 2024-05-28 DIAGNOSIS — G8929 Other chronic pain: Secondary | ICD-10-CM | POA: Diagnosis not present

## 2024-05-28 NOTE — Progress Notes (Unsigned)
 Seth Bryant - 78 y.o. male MRN [id number]  Date of birth: 05/31/1946  Office Visit Note: Visit Date: 05/28/2024 PCP: Norleen Lynwood ORN, MD Referred by: Norleen Lynwood ORN, MD  Subjective: Chief Complaint  Patient presents with   Lower Back - Pain   HPI: Seth Bryant is a 78 y.o. male who comes in today per the request of Dr. Lynwood Norleen for evaluation of chronic, worsening and severe right sided lower back, also reports  intermittent pain to right knee and lateral aspect of right ankle. Right sided lower back is most severe discomfort. He is accompanied by his mother today. Patient is poor historian, history of dementia. Pain started about 1 year ago. His pain worsens with movement and activity, twisting movements cause severe pain. Moving from sitting to standing position causes pain. Describes pain as burning sensation, currently rates as 8 out of 10. Some relief of pain with home exercise regimen, rest and use of medications. He has attended formal physical therapy in the past with some relief of pain. Patient is fairly active, he performs yard work and plays tennis. CT of lumbar spine from 2024 shows multi level degenerative changes and facet arthropathy. There is also moderate spinal canal stenosis noted at L2-L3. No history of lumbar injections. History of lumbar surgery, 40 plus years ago. States he underwent surgery due to multiple spine fractures after jumping down from scaffolding at work. Patient denies focal weakness, numbness and tingling.   Patients course is complicated by memory issues, anxiety, chronic pain syndrome. He is currently living with his mother. She reports issues with patient wondering off, has involved police several times.      Review of Systems  Musculoskeletal:  Positive for back pain.  Neurological:  Negative for tingling, sensory change, focal weakness and weakness.  All other systems reviewed and are negative.  Otherwise per HPI.  Assessment & Plan: Visit Diagnoses:     ICD-10-CM   1. Chronic right-sided low back pain with right-sided sciatica  M54.41 MR LUMBAR SPINE WO CONTRAST   G89.29     2. Lumbar radiculopathy  M54.16 XR Lumbar Spine 2-3 Views    MR LUMBAR SPINE WO CONTRAST       Plan: Findings:  Chronic, worsening and severe right sided lower back pain, pain to right knee and lateral aspect of right ankle. Right sided lower back pain is most severe discomfort. Patient continues to have severe pain despite good conservative therapies such as home exercise regimen, rest and use of medications. I obtained lumbar radiographs in the office today that shows multi level degenerative changes and facet arthropathy. Prior CT of lumbar spine from 2024 shows moderate spinal canal stenosis at L2-L3. Patients clinical presentation and exam are complex, differentials include neurogenic claudication as a result of spinal canal stenosis vs facet mediated pain. The right sided lower back pain does seem to fit more with facet mediated discomfort. Given the chronicity of his pain I placed order for lumbar MRI imaging. Depending on results of MRI imaging we would consider performing lumbar epidural steroid injection vs facet joint injections. His exam today is non-focal, good strength noted to bilateral lower extremities. We will see him back or lumbar MRI review.     Meds & Orders: No orders of the defined types were placed in this encounter.   Orders Placed This Encounter  Procedures   XR Lumbar Spine 2-3 Views   MR LUMBAR SPINE WO CONTRAST    Follow-up: Return for Lumbar MRI  review.   Procedures: No procedures performed      Clinical History: Study Result  Narrative & Impression CLINICAL DATA:  Lumbar compression fracture.   EXAM: CT LUMBAR SPINE WITHOUT CONTRAST   TECHNIQUE: Multidetector CT imaging of the lumbar spine was performed without intravenous contrast administration. Multiplanar CT image reconstructions were also generated.   RADIATION DOSE  REDUCTION: This exam was performed according to the departmental dose-optimization program which includes automated exposure control, adjustment of the mA and/or kV according to patient size and/or use of iterative reconstruction technique.   COMPARISON:  None Available.   FINDINGS: Segmentation: 5 lumbar type vertebrae.   Alignment: Normal   Vertebrae: No acute fracture or focal pathologic process. Confluent osteophytes at T11-L2. Lucency through an L2 anterior inferior endplate osteophyte which is chronic appearing. No adjacent soft tissue edema noted.   Paraspinal and other soft tissues: No evidence of perispinal hematoma or masslike finding.   Disc levels: Generalized degenerative endplate and facet spurring. Degenerative foraminal narrowing especially on the right at L3-4 and below and on the left at L4-5, L5-S1. Ridging and ligamentous thickening causes moderate spinal stenosis at L2-3.   IMPRESSION: 1. No acute finding. 2. Lumbar spine degeneration as described.     Electronically Signed   By: Dorn Roulette M.D.   On: 05/23/2023 10:01   He reports that he has never smoked. He has never used smokeless tobacco.  Recent Labs    12/04/23 1441  HGBA1C 5.6    Objective:  VS:  HT:    WT:   BMI:     BP:   HR: bpm  TEMP: ( )  RESP:  Physical Exam Vitals and nursing note reviewed.  HENT:     Head: Normocephalic and atraumatic.     Right Ear: External ear normal.     Left Ear: External ear normal.     Nose: Nose normal.     Mouth/Throat:     Mouth: Mucous membranes are moist.  Eyes:     Extraocular Movements: Extraocular movements intact.  Cardiovascular:     Rate and Rhythm: Normal rate.     Pulses: Normal pulses.  Pulmonary:     Effort: Pulmonary effort is normal.  Abdominal:     General: Abdomen is flat. There is no distension.  Musculoskeletal:        General: Tenderness present.     Cervical back: Normal range of motion.     Comments: Patient is  slow to rise from seated position to standing. Pain noted with facet loading and lumbar extension. 5/5 strength noted with bilateral hip flexion, knee flexion/extension, ankle dorsiflexion/plantarflexion and EHL. No clonus noted bilaterally. No pain upon palpation of greater trochanters. No pain with internal/external rotation of bilateral hips. Sensation intact bilaterally. Negative slump test bilaterally. Ambulates without aid, gait steady.     Skin:    General: Skin is warm and dry.     Capillary Refill: Capillary refill takes less than 2 seconds.  Neurological:     Mental Status: He is alert and oriented to person, place, and time.  Psychiatric:        Mood and Affect: Mood normal.        Behavior: Behavior normal.     Ortho Exam  Imaging: XR Lumbar Spine 2-3 Views Result Date: 05/28/2024 AP and lateral radiographs show transitional vertebrae. There is multi level degenerative changes, including disc height loss, biforaminal narrowing and anterior spurring. No fractures.    Past Medical/Family/Surgical/Social History:  Medications & Allergies reviewed per EMR, new medications updated. 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   Multiple lacunar infarcts    Thyroid  nodule 06/05/2023   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   Generalized anxiety disorder 06/05/2023   Vitamin B12 deficiency 03/31/2023   Physician orders for life-sustaining treatment (POLST) form indicates patient wish for do-not-resuscitate status 12/19/2022   Senile purpura 12/19/2022   Thrombocytopenia 12/19/2022   Driving safety issue 96/77/7975   Frequent falls 04/25/2022   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   Drug-induced photosensitivity  09/23/2019   Elevated PSA 03/04/2019   Digital nerve laceration, finger 11/12/2017   Chronic pain 09/27/2016   Diverticulosis of colon 10/09/2015   Internal hemorrhoid 10/09/2015   Age-related nuclear cataract of both eyes 11/10/2014   Alcoholism 11/07/2014   Insomnia 04/01/2014   Lateral epicondylitis 06/14/2009   Fatigue 10/14/2008   Polymyalgia rheumatica 06/01/2008   Benign neoplasm of large intestine 05/23/2008   Skin cancer 03/07/2008   Actinic keratosis 03/07/2008   Arthralgia of multiple sites 01/28/2008   Cervical spondylosis 11/26/2006   Neck pain 11/26/2006   Somatic dysfunction of cervical region 11/26/2006   Lumbar spondylosis 11/04/2005   Anesthesia of skin 09/19/2005   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   14Mar2022: Patient continues with alprazolam  1 mg he takes 1 at night-pt counselled re the safety concern with  this medicaion use in combination with ETOHa nd pain meds- pt and wife aware that high risk with these meds for injury- fall, addiction, and even possible death from respiratory supression.- new contract signed by pt with wife present; Impression - 26May2022: Patient continu   Cervical spondylosis 11/26/2006   Chronic pain 09/27/2016   Digital nerve laceration, finger 11/12/2017   Diverticulosis of colon 10/09/2015   Drug-induced photosensitivity 09/23/2019   Elevated PSA 03/04/2019   26May2022: Patient's PSA is up to 8-pending surgical procedure for prostate biopsies with Dr. Donah today's visit is for preop clearance   Fatigue 10/14/2008   01May2023: Fatigue likely associated with low testosterone levels recent treatment of prostate  cancer increasing age decreasing activity levels   Flexor tendon rupture of hand 11/12/2017   Frequent falls 04/25/2022   19Oct2023: The etiology of the falls is unclear but per the wife likely associated with alcohol intake and patient losing his footing-there is been no loss of consciousness-ER evaluation with head CT scan was negative counseled about drinking and using an assistive walking device could be helpful   Generalized anxiety disorder 06/05/2023   Insomnia 04/01/2014   Internal hemorrhoid 10/09/2015   Lateral epicondylitis 06/14/2009   Lumbar spondylosis 11/04/2005   01May2023: Chronic ongoing lower back pain intermittent patient uses Vicodin and/or Tylenol  sometimes nonsteroidal anti-inflammatories for discomfort   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   Multiple lacunar infarcts    Neck pain 11/26/2006   Neoplasm of uncertain behavior of skin 07/18/2014   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   14Mar2022: as above- pt taking hydrocodone  prn- not  taking every day; Impression - 09Feb2023: Patient taking not taking every day cures done today no aberrant behavior; Impression - 01May2023: Patient taking not taking every day cures done today no aberrant behavior-strongly encourage patient to wean off of this medication Tylenol  only as needed new contract has been established   Polymyalgia rheumatica 06/01/2008   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   Senile purpura 12/19/2022   Skin cancer 03/07/2008   Somatic dysfunction of cervical region 11/26/2006   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   Thrombocytopenia 12/19/2022   Thyroid  nodule 06/05/2023   Vitamin B12 deficiency 03/31/2023   Family History  Problem Relation Age of Onset   Heart failure Father    Past Surgical History:  Procedure Laterality Date   ANKLE SURGERY     BACK SURGERY     KNEE SURGERY     SHOULDER SURGERY     Social History   Occupational History   Occupation: Retired  Tobacco Use   Smoking status: Never   Smokeless  tobacco: Never  Vaping Use   Vaping status: Never Used  Substance and Sexual Activity   Alcohol use: Yes    Comment: hx of severe abuse/dependence   Drug use: Not Currently    Comment: alludes to prior cocaine use in younger years   Sexual activity: Not on file

## 2024-05-28 NOTE — Progress Notes (Unsigned)
 Pain Scale   Average Pain 3 Patient advising he has chronic lower back pain radiating to right foot and ankle        +Driver, -BT, -Dye Allergies.

## 2024-05-31 ENCOUNTER — Other Ambulatory Visit: Payer: Self-pay | Admitting: Internal Medicine

## 2024-05-31 DIAGNOSIS — E041 Nontoxic single thyroid nodule: Secondary | ICD-10-CM

## 2024-06-07 ENCOUNTER — Ambulatory Visit: Admitting: Internal Medicine

## 2024-06-07 ENCOUNTER — Encounter: Payer: Self-pay | Admitting: Internal Medicine

## 2024-06-07 VITALS — BP 124/78 | HR 59 | Temp 97.8°F | Ht 65.0 in | Wt 133.0 lb

## 2024-06-07 DIAGNOSIS — F411 Generalized anxiety disorder: Secondary | ICD-10-CM

## 2024-06-07 DIAGNOSIS — G309 Alzheimer's disease, unspecified: Secondary | ICD-10-CM

## 2024-06-07 DIAGNOSIS — Z23 Encounter for immunization: Secondary | ICD-10-CM

## 2024-06-07 DIAGNOSIS — R739 Hyperglycemia, unspecified: Secondary | ICD-10-CM

## 2024-06-07 DIAGNOSIS — E538 Deficiency of other specified B group vitamins: Secondary | ICD-10-CM | POA: Diagnosis not present

## 2024-06-07 DIAGNOSIS — F0280 Dementia in other diseases classified elsewhere without behavioral disturbance: Secondary | ICD-10-CM

## 2024-06-07 DIAGNOSIS — F0150 Vascular dementia without behavioral disturbance: Secondary | ICD-10-CM

## 2024-06-07 DIAGNOSIS — R3129 Other microscopic hematuria: Secondary | ICD-10-CM | POA: Insufficient documentation

## 2024-06-07 DIAGNOSIS — E7800 Pure hypercholesterolemia, unspecified: Secondary | ICD-10-CM

## 2024-06-07 LAB — LIPID PANEL
Cholesterol: 207 mg/dL — ABNORMAL HIGH (ref 0–200)
HDL: 60.2 mg/dL (ref >39.00)
LDL Cholesterol: 131 mg/dL — ABNORMAL HIGH (ref 0–99)
NonHDL: 146.55
Total CHOL/HDL Ratio: 3
Triglycerides: 78 mg/dL (ref 0.0–149.0)
VLDL: 15.6 mg/dL (ref 0.0–40.0)

## 2024-06-07 LAB — URINALYSIS, ROUTINE W REFLEX MICROSCOPIC
Bilirubin Urine: NEGATIVE
Ketones, ur: NEGATIVE
Leukocytes,Ua: NEGATIVE
Nitrite: NEGATIVE
Specific Gravity, Urine: 1.025 (ref 1.000–1.030)
Total Protein, Urine: NEGATIVE
Urine Glucose: NEGATIVE
Urobilinogen, UA: 0.2 (ref 0.0–1.0)
pH: 6 (ref 5.0–8.0)

## 2024-06-07 LAB — BASIC METABOLIC PANEL WITH GFR
BUN: 13 mg/dL (ref 6–23)
CO2: 31 meq/L (ref 19–32)
Calcium: 9.5 mg/dL (ref 8.4–10.5)
Chloride: 102 meq/L (ref 96–112)
Creatinine, Ser: 0.76 mg/dL (ref 0.40–1.50)
GFR: 85.39 mL/min (ref >60.00)
Glucose, Bld: 88 mg/dL (ref 70–99)
Potassium: 4.3 meq/L (ref 3.5–5.1)
Sodium: 141 meq/L (ref 135–145)

## 2024-06-07 LAB — HEPATIC FUNCTION PANEL
ALT: 9 U/L (ref 0–53)
AST: 16 U/L (ref 0–37)
Albumin: 4.5 g/dL (ref 3.5–5.2)
Alkaline Phosphatase: 59 U/L (ref 39–117)
Bilirubin, Direct: 0.1 mg/dL (ref 0.0–0.3)
Total Bilirubin: 0.7 mg/dL (ref 0.2–1.2)
Total Protein: 7.3 g/dL (ref 6.0–8.3)

## 2024-06-07 LAB — HEMOGLOBIN A1C: Hgb A1c MFr Bld: 5.3 % (ref 4.6–6.5)

## 2024-06-07 NOTE — Assessment & Plan Note (Signed)
 Asymptomatic, pt declines urology for now, for f/u ua today

## 2024-06-07 NOTE — Patient Instructions (Signed)
You had the flu shot today  Please continue all other medications as before, and refills have been done if requested.  Please have the pharmacy call with any other refills you may need.  Please continue your efforts at being more active, low cholesterol diet, and weight control  Please keep your appointments with your specialists as you may have planned  Please go to the LAB at the blood drawing area for the tests to be done  You will be contacted by phone if any changes need to be made immediately.  Otherwise, you will receive a letter about your results with an explanation, but please check with MyChart first.  Please make an Appointment to return in 6 months, or sooner if needed

## 2024-06-07 NOTE — Assessment & Plan Note (Signed)
 Lab Results  Component Value Date   VITAMINB12 1,178 (H) 12/04/2023   Stable, cont oral replacement - b12 1000 mcg qd

## 2024-06-07 NOTE — Progress Notes (Signed)
 Patient ID: Seth Bryant, male   DOB: Oct 25, 1945, 78 y.o.   MRN: [id number]        Chief Complaint: follow up HTN, HLD , microhematuria, dementia, low b12       HPI:  Seth Bryant is a 78 y.o. male here overall doing ok,  Pt denies chest pain, increased sob or doe, wheezing, orthopnea, PND, increased LE swelling, palpitations, dizziness or syncope.   Pt denies polydipsia, polyuria, or new focal neuro s/s.    Pt denies fever, wt loss, night sweats, loss of appetite, or other constitutional symptoms  Denies urinary symptoms such as dysuria, frequency, urgency, flank pain, hematuria or n/v, fever, chills.  Due for flu shot and hep C screen.  Dementia overall stable symptomatically, and not assoc with behavioral changes such as hallucinations, paranoia. 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   14Mar2022: Patient continues with alprazolam  1 mg he takes 1 at night-pt counselled re the safety concern with  this medicaion use in combination with ETOHa nd pain meds- pt and wife aware that high risk with these meds for injury- fall, addiction, and even possible death from respiratory supression.- new contract signed by pt with wife present; Impression - 26May2022: Patient continu   Cervical spondylosis 11/26/2006   Chronic pain 09/27/2016   Digital nerve laceration, finger 11/12/2017   Diverticulosis of colon 10/09/2015    Drug-induced photosensitivity 09/23/2019   Elevated PSA 03/04/2019   26May2022: Patient's PSA is up to 8-pending surgical procedure for prostate biopsies with Dr. Donah today's visit is for preop clearance   Fatigue 10/14/2008   01May2023: Fatigue likely associated with low testosterone levels recent treatment of prostate cancer increasing age decreasing activity levels   Flexor tendon rupture of hand 11/12/2017   Frequent falls 04/25/2022   19Oct2023: The etiology of the falls is unclear but per the wife likely associated with alcohol intake and patient losing his footing-there is been no loss of consciousness-ER evaluation with head CT scan was negative counseled about drinking and using an assistive walking device could be helpful   Generalized anxiety disorder 06/05/2023   Insomnia 04/01/2014   Internal hemorrhoid 10/09/2015   Lateral epicondylitis 06/14/2009   Lumbar spondylosis 11/04/2005   01May2023: Chronic ongoing lower back pain intermittent patient uses Vicodin and/or Tylenol  sometimes nonsteroidal anti-inflammatories for discomfort   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   Multiple lacunar infarcts    Neck pain 11/26/2006   Neoplasm of uncertain behavior of skin 07/18/2014   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   14Mar2022: as above- pt taking hydrocodone  prn- not taking every day; Impression - 09Feb2023: Patient taking not taking every day cures done today no aberrant behavior; Impression - 01May2023: Patient taking not taking every day cures done today no aberrant behavior-strongly encourage patient to wean off of this medication Tylenol  only as needed new contract has been established   Polymyalgia rheumatica 06/01/2008  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 He has never used smokeless tobacco. He reports current alcohol use. He reports that he does not currently use drugs. family history includes Heart failure in his father. Allergies  Allergen Reactions   Latex    Current Outpatient Medications on File Prior to Visit  Medication Sig Dispense Refill   ALPRAZolam  (XANAX ) 1 MG tablet TAKE 1 TABLET BY MOUTH TWICE A DAY AS NEEDED FOR ANXIETY 60 tablet 2   aspirin  EC 81 MG tablet Take 1 tablet (81 mg total) by mouth daily. Swallow whole.     donepezil  (ARICEPT ) 10 MG tablet Take by mouth. (Patient not taking: Reported on 01/22/2024)     fluticasone (FLONASE) 50 MCG/ACT nasal spray Place 2 sprays into both nostrils daily. (Patient not taking: Reported on 01/22/2024)     memantine  (NAMENDA ) 10 MG tablet Take 1 tablet  twice a day 180 tablet 3   sildenafil  (VIAGRA ) 100 MG tablet Take 0.5-1 tablets (50-100 mg total) by mouth daily as needed for erectile dysfunction. 10 tablet 11   triamcinolone cream (KENALOG) 0.1 % Apply 1 applicator topically. (Patient not taking: Reported on 01/22/2024)     No current facility-administered medications on file prior to visit.        ROS:  All others reviewed and negative.  Objective        PE:  BP 124/78 (BP Location: Left Arm, Patient Position: Sitting, Cuff Size: Normal)   Pulse (!) 59   Temp 97.8 F (36.6 C) (Oral)   Ht 5' 5 (1.651 m)   Wt 133 lb (60.3 kg)   SpO2 100%   BMI 22.13 kg/m                 Constitutional: Pt appears in NAD               HENT: Head: NCAT.                Right Ear: External ear normal.                 Left Ear: External ear normal.                Eyes: . Pupils are equal, round, and reactive to light. Conjunctivae and EOM are normal               Nose: without d/c or deformity               Neck: Neck supple.  Gross normal ROM               Cardiovascular: Normal rate and regular rhythm.                 Pulmonary/Chest: Effort normal and breath sounds without rales or wheezing.                Abd:  Soft, NT, ND, + BS, no organomegaly               Neurological: Pt  is alert. At baseline orientation, motor grossly intact               Skin: Skin is warm. No rashes, no other new lesions, LE edema - none               Psychiatric: Pt behavior is normal without agitation   Micro: none  Cardiac tracings I have personally interpreted today:  none  Pertinent Radiological findings (summarize): none   Lab Results  Component Value Date   WBC 4.2 12/04/2023   HGB 15.0 12/04/2023   HCT 45.9 12/04/2023   PLT 148.0 (L) 12/04/2023   GLUCOSE 88 06/07/2024   CHOL 207 (H) 06/07/2024   TRIG 78.0 06/07/2024   HDL 60.20 06/07/2024   LDLCALC 131 (H) 06/07/2024   ALT 9 06/07/2024   AST 16 06/07/2024   NA 141 06/07/2024   K 4.3 06/07/2024   CL 102 06/07/2024   CREATININE 0.76 06/07/2024   BUN 13 06/07/2024   CO2 31 06/07/2024   TSH 0.78 12/04/2023   PSA 0.08 (L) 12/04/2023   INR 1.0 05/06/2019   HGBA1C 5.3 06/07/2024   Assessment/Plan:  Kavari Parrillo is a 78 y.o. White or Caucasian [1] male with  has a past medical history of Actinic keratosis (03/07/2008), Adenocarcinoma of prostate (12/19/2020), Age-related nuclear cataract of both eyes (11/10/2014), Alcoholism (11/07/2014), Anesthesia of skin (09/19/2005), Arthralgia of multiple sites (01/28/2008), Benign neoplasm of large intestine (05/23/2008), Benzodiazepine dependence (12/27/2019), Cervical spondylosis (11/26/2006), Chronic pain (09/27/2016), Digital nerve laceration, finger (11/12/2017), Diverticulosis of colon (10/09/2015), Drug-induced photosensitivity (09/23/2019), Elevated PSA (03/04/2019), Fatigue (10/14/2008), Flexor tendon rupture of hand (11/12/2017), Frequent falls (04/25/2022), Generalized anxiety disorder (06/05/2023), Insomnia (04/01/2014),  Internal hemorrhoid (10/09/2015), Lateral epicondylitis (06/14/2009), Lumbar spondylosis (11/04/2005), Mixed dementia (08/14/2023), Multiple lacunar infarcts, Neck pain (11/26/2006), Neoplasm of uncertain behavior of skin (07/18/2014), Opium  dependence (11/22/2019), Polymyalgia rheumatica (06/01/2008), Prurigo nodularis (09/23/2019), Senile purpura (12/19/2022), Skin cancer (03/07/2008), Somatic dysfunction of cervical region (11/26/2006), Spinal stenosis of lumbar region (06/05/2023), Thrombocytopenia (12/19/2022), Thyroid  nodule (06/05/2023), and Vitamin B12 deficiency (03/31/2023).  Mixed dementia Stable, continue current med tx  Vitamin B12 deficiency Lab Results  Component Value Date   VITAMINB12 1,178 (H) 12/04/2023   Stable, cont oral replacement - b12 1000 mcg qd   Microhematuria Asymptomatic, pt declines urology for now, for f/u ua today  Generalized anxiety disorder Overall stable, reassured pt, continue current med tx  Followup: Return in about 6 months (around 12/06/2024).  Lynwood Norleen, MD 06/07/2024 7:53 PM [facility]

## 2024-06-07 NOTE — Assessment & Plan Note (Signed)
 Overall stable, reassured pt, continue current med tx

## 2024-06-07 NOTE — Assessment & Plan Note (Signed)
Stable, continue current med tx ?

## 2024-06-08 ENCOUNTER — Ambulatory Visit: Payer: Self-pay | Admitting: Internal Medicine

## 2024-06-08 ENCOUNTER — Encounter: Payer: Self-pay | Admitting: Internal Medicine

## 2024-06-08 DIAGNOSIS — E785 Hyperlipidemia, unspecified: Secondary | ICD-10-CM | POA: Insufficient documentation

## 2024-06-09 ENCOUNTER — Telehealth: Payer: Self-pay

## 2024-06-09 ENCOUNTER — Other Ambulatory Visit: Payer: Self-pay

## 2024-06-09 DIAGNOSIS — E7800 Pure hypercholesterolemia, unspecified: Secondary | ICD-10-CM

## 2024-06-09 MED ORDER — ROSUVASTATIN CALCIUM 10 MG PO TABS: 10.0000 mg | ORAL_TABLET | Freq: Every day | ORAL | 0 refills | Status: AC

## 2024-06-09 NOTE — Telephone Encounter (Signed)
 Copied from CRM #[id number]. Topic: Clinical - Medication Question >> Jun 09, 2024 11:06 AM Sophia H wrote: Reason for CRM: Patient upset, states he was told medication would be called in and he has been contacting the pharmacy since yesterday and nothing is there.  Needing Crestor 10 mg to be sent in, patient would like a call from clinic once this is done. Patient also states he did not understand what the medication was for when clinic reached out, needing clarification.   [id number] - [place], [place] - [address]

## 2024-06-09 NOTE — Telephone Encounter (Signed)
 Copied from CRM #[id number]. Topic: Clinical - Medication Question >> Jun 09, 2024 11:06 AM Sophia H wrote: Reason for CRM: Patient upset, states he was told medication would be called in and he has been contacting the pharmacy since yesterday and nothing is there.  Needing Crestor 10 mg to be sent in, patient would like a call from clinic once this is done. Patient also states he did not understand what the medication was for when clinic reached out, needing clarification.   [id number] [place], [place] - [address] >> Jun 09, 2024  2:51 PM Robinson H wrote: Patient following up on message sent today regarding medication that was to be sent to pharmacy, patient states he's not sure which medication or what it is for. No new medications on list sent to pharmacy. Okay to leave message  Yvette [phone]

## 2024-06-09 NOTE — Telephone Encounter (Signed)
 This has been done.

## 2024-06-10 ENCOUNTER — Inpatient Hospital Stay: Admission: RE | Admit: 2024-06-10 | Discharge: 2024-06-10 | Attending: Internal Medicine | Admitting: Internal Medicine

## 2024-06-10 DIAGNOSIS — E041 Nontoxic single thyroid nodule: Secondary | ICD-10-CM

## 2024-06-11 ENCOUNTER — Ambulatory Visit: Payer: Self-pay | Admitting: Internal Medicine

## 2024-06-14 ENCOUNTER — Ambulatory Visit: Admitting: Physical Medicine and Rehabilitation

## 2024-06-23 ENCOUNTER — Inpatient Hospital Stay
Admission: RE | Admit: 2024-06-23 | Discharge: 2024-06-23 | Attending: Physical Medicine and Rehabilitation | Admitting: Physical Medicine and Rehabilitation

## 2024-06-23 DIAGNOSIS — G8929 Other chronic pain: Secondary | ICD-10-CM

## 2024-06-23 DIAGNOSIS — M5416 Radiculopathy, lumbar region: Secondary | ICD-10-CM

## 2024-07-05 ENCOUNTER — Telehealth: Payer: Self-pay | Admitting: Internal Medicine

## 2024-07-05 NOTE — Telephone Encounter (Signed)
 Patient (uncle of Seth Bryant from [facility]) is requesting Dr. Garald take over as PCP once Dr. Norleen is retired. Please advise.

## 2024-07-05 NOTE — Telephone Encounter (Signed)
Ok with me, thanks.

## 2024-07-28 ENCOUNTER — Ambulatory Visit

## 2024-07-28 VITALS — BP 130/80 | HR 68 | Ht 65.0 in | Wt 134.8 lb

## 2024-07-28 DIAGNOSIS — Z0000 Encounter for general adult medical examination without abnormal findings: Secondary | ICD-10-CM | POA: Diagnosis not present

## 2024-07-28 NOTE — Progress Notes (Signed)
 "  Chief Complaint  Patient presents with   Medicare Wellness     Subjective:   Seth Bryant is a 79 y.o. male who presents for a Medicare Annual Wellness Visit.  Visit info / Clinical Intake: Medicare Wellness Visit Type:: Subsequent Annual Wellness Visit Persons participating in visit and providing information:: patient Medicare Wellness Visit Mode:: In-person (required for WTM) Interpreter Needed?: No Pre-visit prep was completed: yes AWV questionnaire completed by patient prior to visit?: no Living arrangements:: with family/others (lives with Mother) Patient's Overall Health Status Rating: good Typical amount of pain: none Does pain affect daily life?: no Are you currently prescribed opioids?: no  Dietary Habits and Nutritional Risks How many meals a day?: 3 Eats fruit and vegetables daily?: yes Most meals are obtained by: preparing own meals; eating out In the last 2 weeks, have you had any of the following?: none Diabetic:: no  Functional Status Activities of Daily Living (to include ambulation/medication): Independent Ambulation: Independent with device- listed below Home Assistive Devices/Equipment: Eyeglasses Medication Administration: Independent Home Management (perform basic housework or laundry): Independent Manage your own finances?: yes Primary transportation is: driving Concerns about vision?: no *vision screening is required for WTM* Concerns about hearing?: no  Fall Screening Falls in the past year?: 0 Number of falls in past year: 0 Was there an injury with Fall?: 0 Fall Risk Category Calculator: 0 Patient Fall Risk Level: Low Fall Risk  Fall Risk Patient at Risk for Falls Due to: No Fall Risks Fall risk Follow up: Falls evaluation completed; Falls prevention discussed  Home and Transportation Safety: All rugs have non-skid backing?: N/A, no rugs All stairs or steps have railings?: yes (outside) Grab bars in the bathtub or shower?: (!)  no Have non-skid surface in bathtub or shower?: yes Good home lighting?: yes Regular seat belt use?: yes Hospital stays in the last year:: no  Cognitive Assessment Difficulty concentrating, remembering, or making decisions? : yes (slight) Will 6CIT or Mini Cog be Completed: yes What year is it?: 0 points What month is it?: 0 points Give patient an address phrase to remember (5 components): [address] [place], [place] About what time is it?: 0 points Count backwards from 20 to 1: 0 points Say the months of the year in reverse: 0 points Repeat the address phrase from earlier: 4 points ([address] [place]) 6 CIT Score: 4 points  Advance Directives (For Healthcare) Does Patient Have a Medical Advance Directive?: No Type of Advance Directive: Midwife; Living will Copy of Healthcare Power of Attorney in Chart?: No - copy requested Copy of Living Will in Chart?: No - copy requested Would patient like information on creating a medical advance directive?: No - Patient declined  Reviewed/Updated  Reviewed/Updated: Reviewed All (Medical, Surgical, Family, Medications, Allergies, Care Teams, Patient Goals)    Allergies (verified) Latex   Current Medications (verified) Outpatient Encounter Medications as of 07/28/2024  Medication Sig   ALPRAZolam  (XANAX ) 1 MG tablet TAKE 1 TABLET BY MOUTH TWICE A DAY AS NEEDED FOR ANXIETY   aspirin  EC 81 MG tablet Take 1 tablet (81 mg total) by mouth daily. Swallow whole.   fluticasone (FLONASE) 50 MCG/ACT nasal spray Place 2 sprays into both nostrils daily.   Melatonin 1 MG CAPS Take 1 mg by mouth at bedtime.   memantine  (NAMENDA ) 10 MG tablet Take 1 tablet  twice a day   rosuvastatin  (CRESTOR ) 10 MG tablet Take 1 tablet (10 mg total) by mouth daily.  sildenafil  (VIAGRA ) 100 MG tablet Take 0.5-1 tablets (50-100 mg total) by mouth daily as needed for erectile dysfunction.   donepezil  (ARICEPT ) 10 MG tablet Take by mouth. (Patient not  taking: Reported on 07/28/2024)   triamcinolone cream (KENALOG) 0.1 % Apply 1 applicator topically. (Patient not taking: Reported on 07/28/2024)   No facility-administered encounter medications on file as of 07/28/2024.    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   14Mar2022: Patient continues with alprazolam  1 mg he takes 1 at night-pt counselled re the safety concern with  this medicaion use in combination with ETOHa nd pain meds- pt and wife aware that high risk with these meds for injury- fall, addiction, and even possible death from respiratory supression.- new contract signed by pt with wife present; Impression - 26May2022: Patient continu   Cervical spondylosis 11/26/2006   Chronic pain 09/27/2016   Digital nerve laceration, finger 11/12/2017   Diverticulosis of colon 10/09/2015   Drug-induced photosensitivity 09/23/2019   Elevated PSA 03/04/2019   26May2022: Patient's PSA is up to 8-pending surgical procedure for prostate biopsies with Dr. Donah today's visit is for preop clearance   Fatigue 10/14/2008   01May2023: Fatigue likely associated with low testosterone levels recent treatment of prostate cancer increasing age decreasing activity levels   Flexor tendon rupture of hand 11/12/2017   Frequent falls 04/25/2022   19Oct2023: The etiology of the falls is unclear but per the wife likely associated with alcohol intake and patient losing his footing-there is been no loss of consciousness-ER evaluation with head CT scan was negative counseled about drinking and using an assistive walking device could be helpful   Generalized anxiety disorder 06/05/2023   Insomnia  04/01/2014   Internal hemorrhoid 10/09/2015   Lateral epicondylitis 06/14/2009   Lumbar spondylosis 11/04/2005   01May2023: Chronic ongoing lower back pain intermittent patient uses Vicodin and/or Tylenol  sometimes nonsteroidal anti-inflammatories for discomfort   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   Multiple lacunar infarcts    Neck pain 11/26/2006   Neoplasm of uncertain behavior of skin 07/18/2014   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   14Mar2022: as above- pt taking hydrocodone  prn- not taking every day; Impression - 09Feb2023: Patient taking not taking every day cures done today no aberrant behavior; Impression - 01May2023: Patient taking not taking every day cures done today no aberrant behavior-strongly encourage patient to wean off of this medication Tylenol  only as needed new contract has been established   Polymyalgia rheumatica 06/01/2008   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   Senile purpura 12/19/2022   Skin cancer 03/07/2008   Somatic dysfunction of cervical region 11/26/2006   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   Thrombocytopenia 12/19/2022   Thyroid  nodule 06/05/2023   Vitamin B12 deficiency 03/31/2023   Past Surgical History:  Procedure Laterality Date   ANKLE SURGERY     BACK SURGERY     KNEE SURGERY     SHOULDER SURGERY     Family History  Problem Relation Age of Onset   Heart failure Father    Social History   Occupational History   Occupation: Retired  Tobacco Use   Smoking status: Never   Smokeless tobacco: Never  Vaping Use   Vaping status: Never Used  Substance and Sexual Activity   Alcohol use: Not Currently    Comment: hx of severe abuse/dependence   Drug use: Not Currently    Comment: alludes to prior cocaine use in younger years   Sexual activity: Yes   Tobacco Counseling Counseling given: No  SDOH Screenings   Food Insecurity: No Food Insecurity (07/28/2024)  Housing: Unknown (07/28/2024)  Transportation Needs: No Transportation Needs (07/28/2024)   Utilities: Not At Risk (07/28/2024)  Alcohol Screen: Low Risk (08/01/2023)  Depression (PHQ2-9): Low Risk (07/28/2024)  Financial Resource Strain: Low Risk (08/01/2023)  Physical Activity: Inactive (07/28/2024)  Social Connections: Moderately Isolated (07/28/2024)  Stress: No Stress Concern Present (07/28/2024)  Tobacco Use: Low Risk (07/28/2024)  Health Literacy: Adequate Health Literacy (07/28/2024)   See flowsheets for full screening details  Depression Screen PHQ 2 & 9 Depression Scale- Over the past 2 weeks, how often have you been bothered by any of the following problems? Little interest or pleasure in doing things: 0 Feeling down, depressed, or hopeless (PHQ Adolescent also includes...irritable): 0 PHQ-2 Total Score: 0 Trouble falling or staying asleep, or sleeping too much: 0 Feeling tired or having little energy: 0 Poor appetite or overeating (PHQ Adolescent also includes...weight loss): 0 Feeling bad about yourself - or that you are a failure or have let yourself or your family down: 0 Trouble concentrating on things, such as reading the newspaper or watching television (PHQ Adolescent also includes...like school work): 0 Moving or speaking so slowly that other people could have noticed. Or the opposite - being so fidgety or restless that you have been moving around a lot more than usual: 0 Thoughts that you would be better off dead, or of hurting yourself in some way: 0 PHQ-9 Total Score: 0 If you checked off any problems, how difficult have these problems made it for you to do your work, take care of things at home, or get along with other people?: Not difficult at all  Depression Treatment Depression Interventions/Treatment : Medication; Currently on Treatment; PHQ2-9 Score <4 Follow-up Not Indicated     Goals Addressed               This Visit's Progress     Patient Stated (pt-stated)        Patient stated he plans to stay active             Objective:     Today's Vitals   07/28/24 1328  BP: 130/80  Pulse: 68  SpO2: 98%  Weight: 134 lb 12.8 oz (61.1 kg)  Height: 5' 5 (1.651 m)   Body mass index is 22.43 kg/m.  Hearing/Vision screen Hearing Screening - Comments:: Denies hearing difficulties   Vision Screening - Comments:: Wears eyeglasses for  - up to date with routine eye exams with an Optometrist from [place]  (given [facility] info) Immunizations and Health Maintenance Health Maintenance  Topic Date Due   Zoster Vaccines- Shingrix (1 of 2) 10/05/1964   Hepatitis C Screening  12/03/2024 (Originally 10/05/1963)   Medicare Annual Wellness (AWV)  07/28/2025   DTaP/Tdap/Td (3 - Td or Tdap) 11/06/2027   Pneumococcal Vaccine: 50+ Years  Completed   Influenza Vaccine  Completed   Meningococcal B Vaccine  Aged Out   COVID-19 Vaccine  Discontinued        Assessment/Plan:  This is a routine wellness examination for Seth Bryant.  Patient Care Team: Norleen Lynwood ORN, MD as PCP - General (Internal Medicine) Pa, [facility]  I have personally reviewed  and noted the following in the patients chart:   Medical and social history Use of alcohol, tobacco or illicit drugs  Current medications and supplements including opioid prescriptions. Functional ability and status Nutritional status Physical activity Advanced directives List of other physicians Hospitalizations, surgeries, and ER visits in previous 12 months Vitals Screenings to include cognitive, depression, and falls Referrals and appointments  No orders of the defined types were placed in this encounter.  In addition, I have reviewed and discussed with patient certain preventive protocols, quality metrics, and best practice recommendations. A written personalized care plan for preventive services as well as general preventive health recommendations were provided to patient.   Verdie CHRISTELLA Saba, CMA   07/28/2024   Return in 1 year (on  07/28/2025).  After Visit Summary: (In Person-Declined) Patient declined AVS at this time.  Nurse Notes: scheduled 2027 AWV appt "

## 2024-07-28 NOTE — Patient Instructions (Signed)
 Mr. Seth Bryant,  Thank you for taking the time for your Medicare Wellness Visit. I appreciate your continued commitment to your health goals. Please review the care plan we discussed, and feel free to reach out if I can assist you further.  Please note that Annual Wellness Visits do not include a physical exam. Some assessments may be limited, especially if the visit was conducted virtually. If needed, we may recommend an in-person follow-up with your provider.  Ongoing Care Seeing your primary care provider every 3 to 6 months helps us  monitor your health and provide consistent, personalized care.   Referrals If a referral was made during today's visit and you haven't received any updates within two weeks, please contact the referred provider directly to check on the status.  Recommended Screenings:  Health Maintenance  Topic Date Due   Zoster (Shingles) Vaccine (1 of 2) 10/05/1964   Hepatitis C Screening  12/03/2024*   Medicare Annual Wellness Visit  07/28/2025   DTaP/Tdap/Td vaccine (3 - Td or Tdap) 11/06/2027   Pneumococcal Vaccine for age over 17  Completed   Flu Shot  Completed   Meningitis B Vaccine  Aged Out   COVID-19 Vaccine  Discontinued  *Topic was postponed. The date shown is not the original due date.       12/30/2023    2:51 PM  Advanced Directives  Does Patient Have a Medical Advance Directive? No  Would patient like information on creating a medical advance directive? No - Patient declined    Vision: Annual vision screenings are recommended for early detection of glaucoma, cataracts, and diabetic retinopathy. These exams can also reveal signs of chronic conditions such as diabetes and high blood pressure.  Dental: Annual dental screenings help detect early signs of oral cancer, gum disease, and other conditions linked to overall health, including heart disease and diabetes.

## 2024-07-29 NOTE — Progress Notes (Signed)
 "    Dementia of unclear etiology, concern for Alzheimer's disease Prior alcohol abuse and dependence   Seth Bryant Seth Bryant is a delightful 79 y.o. RH male with a history of  hypertension, hyperlipidemia, anxiety, B12 deficiency, history of thyroid  nodule, BPPV, chronic pain syndrome due to moderate lumbar stenosis and a diagnosis of mixed dementia (possibly Alzheimer's disease likely exacerbated by prior alcohol abuse and dependence) ***seen today in follow up for memory loss. Patient is currently on memantine  10 mg twice daily***.   Patient was last seen on 12/30/2023***. Memory is ***. MMSE today is  /30. Patient is able to participate on ADLs and continues to drive without difficulties. Mood is anxious*** . This patient is accompanied in the office by his 30 year old mother who cannot supplement and of history given decreased comprehension and very hard of hearing issues*** He continues to consume muscle relaxers, pain medications, Xanax  and moderate quantities.   Follow up in  months Continue memantine  10 mg twice daily and donepezil  10 mg daily med side effects discussed Replenish B12 Avoid pain medications and muscle relaxers in excess, as well as Xanax  which could contribute to memory decline Recommend good control of cardiovascular risk factors.   Continue to control mood as per PCP  Continue vestibular therapy for benign positional vertigo Continue PT with orthopedics to improve mobility and strength   Discussed the use of AI scribe software for clinical note transcription with the patient, who gave verbal consent to proceed.  History of Present Illness     Any changes in memory since last visit? No changes. He does not want to elaborate, repeats the same statement Do I have a memory issue, I did not know repeats oneself?  Endorsed Disoriented when walking into a room?  Patient denies    Leaving objects?  May misplace things but not in unusual places   Wandering behavior?  denies    Any personality changes since last visit?  denies   Any worsening depression?:  Endorsed by his mother, he refuses to see psychiatry.  Hallucinations or paranoia?  Denies.   Seizures? denies    Any sleep changes?  Denies vivid dreams, REM behavior or sleepwalking   Sleep apnea?   Denies.   Any hygiene concerns? Denies.  Independent of bathing and dressing?  Endorsed  Does the patient needs help with medications?  Patient is in charge, may be missing doses  Who is in charge of the finances?  Patient  is in charge     Any changes in appetite?  denies     Patient have trouble swallowing? Denies.   Does the patient cook? No Any headaches?   denies   Chronic back pain  Endorsed, chronic right sided lower back pain, due to lumbar spine degeneration, takes multiple agents.  He is followed by orthopedics Ambulates with difficulty? Denies.    Recent falls or head injuries? denies     Unilateral weakness, numbness or tingling? denies   Any tremors?  Denies   Any anosmia?  Denies   Any incontinence of urine? Denies  Any bowel dysfunction?   Denies      Patient lives with his 9 yo mother  Does the patient drive? No longer drives     MRI of the brain on June 29, 2023, personally reviewed remarkable for mild chronic small vessel ischemic changes within the cerebral white matter, chronic lacunar infarcts within the left basal ganglia, as well as mild to moderate generalized cerebral atrophy and  mild cerebellar atrophy.  No acute findings.     Neuropsych evaluation 08/21/2023 briefly, results suggested significant impairment surrounding semantic fluency and all aspects of verbal learning and memory. Additional weaknesses were exhibited across executive functioning and receptive language, while variability was exhibited across processing speed and confrontation naming. 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 I do have noteworthy concern for an underlying neurodegenerative  illness, namely Alzheimer's disease. Seth Bryant did not benefit from repeated exposure across learning trials, exhibited very poor retention rates across verbal measures, and performed poorly across yes/no recognition trials. Taken together, this raises concern for rapid forgetting and an evolving and already pronounced storage impairment. Additional impairment/weakness surrounding semantic fluency, confrontation naming, and executive functioning would follow a fairly classic progression. Visual memory remained intact which is encouraging and could suggest this illness being in somewhat earlier stages if truly present. This should certainly remain on his differential. He was diagnosed with an alcohol-related dementia presentation by his prior neurologist in [place] . Given chronically elevated consumption rates, this also remains a plausible. Testing wise, Seth Bryant exhibited normal performances across attention/concentration and visuospatial abilities, which would be atypical for a classic alcohol dementia presentation. 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 However, this remains somewhat speculative at the present time.     Initial visit 07/17/2023 How long did patient have memory difficulties?  For about 6 months, worse over the last month. Iittle things patient reports some difficulty remembering new information, recent conversations, names.  He has recently moved from [place]  to live with his mother after his recent divorce 4 months ago. Repeats oneself?  Endorsed Disoriented when walking into a room?  Patient denies Leaving objects in unusual places?  denies   Wandering behavior?  Denies Any personality changes, or depression, anxiety?  He has a history of anxiety, situational stress caring for his  45 year old mother and a recent separation from his wife 4 months ago.  Hallucinations or paranoia?  Not recently.  3 years ago he may have seen a shadow, a black figure but he states that was his wife instead.  There were no recurrences. Seizures? Denies.    Any sleep changes?  Does not sleep well if he does not take Xanax . Denies vivid dreams, REM behavior or sleepwalking  Sleep apnea? Denies.   Any hygiene concerns?  Denies.   Independent of bathing and dressing? Denies  Does the patient need help with medications? Patient is in charge   Who is in charge of the finances?  Mother is in charge     Any changes in appetite?   Denies.     Patient have trouble swallowing?  Denies.   Does the patient cook? No  Any headaches?  He has intermittent tension headaches  Chronic pain?  He has a history of intermittent right lower back pain which is chronic, sees ortho care. Ambulates with difficulty?  Tries to remain active, walks outside, plays tennis. Recent falls or head injuries? Denies.     Vision changes?  Denies any new issues.   Any strokelike symptoms? Denies.   Any tremors? Denies.  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 Denies.   Any bowel dysfunction? Denies.      Patient lives with his mother History of heavy alcohol intake?  Denies any alcohol History of  heavy tobacco use? Denies.   Family history of dementia?  Denies   Does patient drive? No  He has a scientist, water quality in psychology Retired psychologist, occupational with the [employer] at [employer]         No data to display            07/17/2023    1:00 PM  [employer] Assessment   Visuospatial/ Executive (0/5) 1  Naming (0/3) 3  Attention: Read list of digits (0/2) 2  Attention: Read list of letters (0/1) 1  Attention: Serial 7 subtraction starting at 100 (0/3) 2  Language: Repeat phrase (0/2) 0  Language : Fluency (0/1) 1  Abstraction (0/2) 1  Delayed Recall (0/5) 1   Orientation (0/6) 3  Total 15  Adjusted Score (based on education) 15      Objective:    Neurological Exam:    VITALS:  There were no vitals filed for this visit.  GEN:  The patient appears stated age and is in NAD. HEENT:  Normocephalic, atraumatic.   Neurological examination:  General: NAD, well-groomed, appears stated age. Orientation: The patient is alert. Oriented to person, place and not to date Cranial nerves: There is good facial symmetry.The speech is fluent and clear, tangential at times. No aphasia or dysarthria. Fund of knowledge is decreased. Recent and remote memory are impaired. Attention and concentration are reduced. Able to name objects and repeat phrases.  Hearing is intact to conversational tone. *** Sensation: Sensation is intact to light touch throughout Motor: Strength is at least antigravity x4. DTR's 2/4 in UE/LE     Movement examination:  Tone: There is normal tone in the UE/LE Abnormal movements:  no tremor.  No myoclonus.  No asterixis.   Coordination:  There is no decremation with RAM's. Normal finger to nose  Gait and Station: The patient has no*** difficulty arising out of a deep-seated chair without the use of the hands. The patient's stride length is good.  Gait is cautious and narrow.  L LE ataxia noted   Thank you for allowing us  the opportunity to participate in the care of this nice patient. Please do not hesitate to contact us  for any questions or concerns.   Total time spent on today's visit was *** minutes dedicated to this patient today, preparing to see patient, examining the patient, ordering tests and/or medications and counseling the patient, documenting clinical information in the EHR or other health record, independently interpreting results and communicating results to the patient/family, discussing treatment and goals, answering patient's questions and coordinating care.  Cc:  Norleen Lynwood ORN, MD  Camie Sevin 07/29/2024 5:52 AM       "

## 2024-07-30 ENCOUNTER — Encounter: Payer: Self-pay | Admitting: Physician Assistant

## 2024-07-30 ENCOUNTER — Ambulatory Visit: Admitting: Physician Assistant

## 2024-07-30 ENCOUNTER — Other Ambulatory Visit: Payer: Self-pay

## 2024-07-30 VITALS — BP 114/63 | HR 75 | Resp 20 | Ht 66.0 in | Wt 138.0 lb

## 2024-07-30 DIAGNOSIS — F0150 Vascular dementia without behavioral disturbance: Secondary | ICD-10-CM

## 2024-07-30 DIAGNOSIS — F0280 Dementia in other diseases classified elsewhere without behavioral disturbance: Secondary | ICD-10-CM

## 2024-07-30 DIAGNOSIS — G309 Alzheimer's disease, unspecified: Secondary | ICD-10-CM | POA: Diagnosis not present

## 2024-07-30 MED ORDER — MEMANTINE HCL 10 MG PO TABS: ORAL_TABLET | ORAL | 3 refills | Status: AC

## 2024-07-30 MED ORDER — DONEPEZIL HCL 10 MG PO TABS: 10.0000 mg | ORAL_TABLET | Freq: Every day | ORAL | 11 refills | Status: AC

## 2024-07-30 NOTE — Patient Instructions (Addendum)
 It was a pleasure to see you today at our office.   Recommendations:    Continue  Memantine  to 10 mg   twice daily.    Continue donepezil  10 mg daily Follow up  August 20  at 11:30  Check the hearing           For psychiatric meds, mood meds: Please have your primary care physician manage these medications.  If you have any severe symptoms of a stroke, or other severe issues such as confusion,severe chills or fever, etc call 911 or go to the ER as you may need to be evaluated further    For assessment of decision of mental capacity and competency:  Call Dr. Rosaline Nine, geriatric psychiatrist at [phone]  Counseling regarding caregiver distress, including caregiver depression, anxiety and issues regarding community resources, adult day care programs, adult living facilities, or memory care questions:  please contact your  Primary Doctor's Social Worker   Whom to call: Memory  decline, memory medications: Call our office [phone]    [email or web address]   RECOMMENDATIONS FOR ALL PATIENTS WITH MEMORY PROBLEMS: 1. Continue to exercise (Recommend 30 minutes of walking everyday, or 3 hours every week) 2. Increase social interactions - continue going to [place] and enjoy social gatherings with friends and family 3. Eat healthy, avoid fried foods and eat more fruits and vegetables 4. Maintain adequate blood pressure, blood sugar, and blood cholesterol level. Reducing the risk of stroke and cardiovascular disease also helps promoting better memory. 5. Avoid stressful situations. Live a simple life and avoid aggravations. Organize your time and prepare for the next day in anticipation. 6. Sleep well, avoid any interruptions of sleep and avoid any distractions in the bedroom that may interfere with adequate sleep quality 7. Avoid sugar, avoid sweets as there is a strong link between excessive sugar intake, diabetes, and  cognitive impairment We discussed the Mediterranean diet, which has been shown to help patients reduce the risk of progressive memory disorders and reduces cardiovascular risk. This includes eating fish, eat fruits and green leafy vegetables, nuts like almonds and hazelnuts, walnuts, and also use olive oil. Avoid fast foods and fried foods as much as possible. Avoid sweets and sugar as sugar use has been linked to worsening of memory function.  There is always a concern of gradual progression of memory problems. If this is the case, then we may need to adjust level of care according to patient needs. Support, both to the patient and caregiver, should then be put into place.      You have been referred for a neuropsychological evaluation (i.e., evaluation of memory and thinking abilities). Please bring someone with you to this appointment if possible, as it is helpful for the doctor to hear from both you and another adult who knows you well. Please bring eyeglasses and hearing aids if you wear them.    The evaluation will take approximately 3 hours and has two parts:   The first part is a clinical interview with the neuropsychologist (Dr. Richie or Dr. Jackquline). During the interview, the neuropsychologist will speak with you and the individual you brought to the appointment.    The second part of the evaluation is testing with the doctor's technician Neal or Luke). During the testing, the technician will ask you to remember different types of material, solve problems, and answer some questionnaires. Your family member will not be present for this portion of the evaluation.   Please note: We must reserve several  hours of the neuropsychologist's time and the psychometrician's time for your evaluation appointment. As such, there is a No-Show fee of $100. If you are unable to attend any of your appointments, please contact our office as soon as possible to reschedule.      DRIVING: Regarding driving,  in patients with progressive memory problems, driving will be impaired. We advise to have someone else do the driving if trouble finding directions or if minor accidents are reported. Independent driving assessment is available to determine safety of driving.   If you are interested in the driving assessment, you can contact the following:  [facility] in [place] [phone]  [facility] [phone]  [facility] [phone]  [facility] [phone] or [phone]   FALL PRECAUTIONS: Be cautious when walking. Scan the area for obstacles that may increase the risk of trips and falls. When getting up in the mornings, sit up at the edge of the bed for a few minutes before getting out of bed. Consider elevating the bed at the head end to avoid drop of blood pressure when getting up. Walk always in a well-lit room (use night lights in the walls). Avoid area rugs or power cords from appliances in the middle of the walkways. Use a walker or a cane if necessary and consider physical therapy for balance exercise. Get your eyesight checked regularly.  FINANCIAL OVERSIGHT: Supervision, especially oversight when making financial decisions or transactions is also recommended.  HOME SAFETY: Consider the safety of the kitchen when operating appliances like stoves, microwave oven, and blender. Consider having supervision and share cooking responsibilities until no longer able to participate in those. Accidents with firearms and other hazards in the house should be identified and addressed as well.   ABILITY TO BE LEFT ALONE: If patient is unable to contact 911 operator, consider using LifeLine, or when the need is there, arrange for someone to stay with patients. Smoking is a fire hazard, consider supervision or cessation. Risk of wandering should be assessed by caregiver and if detected at any point, supervision and safe proof recommendations should be  instituted.  MEDICATION SUPERVISION: Inability to self-administer medication needs to be constantly addressed. Implement a mechanism to ensure safe administration of the medications.      Mediterranean Diet A Mediterranean diet refers to food and lifestyle choices that are based on the traditions of countries located on the [place]. This way of eating has been shown to help prevent certain conditions and improve outcomes for people who have chronic diseases, like kidney disease and heart disease. What are tips for following this plan? Lifestyle  Cook and eat meals together with your family, when possible. Drink enough fluid to keep your urine clear or pale yellow. Be physically active every day. This includes: Aerobic exercise like running or swimming. Leisure activities like gardening, walking, or housework. Get 7-8 hours of sleep each night. If recommended by your health care provider, drink red wine in moderation. This means 1 glass a day for nonpregnant women and 2 glasses a day for men. A glass of wine equals 5 oz (150 mL). Reading food labels  Check the serving size of packaged foods. For foods such as rice and pasta, the serving size refers to the amount of cooked product, not dry. Check the total fat in packaged foods. Avoid foods that have saturated fat or trans fats. Check the ingredients list for added sugars, such as corn syrup. Shopping  At the grocery store, buy  most of your food from the areas near the walls of the store. This includes: Fresh fruits and vegetables (produce). Grains, beans, nuts, and seeds. Some of these may be available in unpackaged forms or large amounts (in bulk). Fresh seafood. Poultry and eggs. Low-fat dairy products. Buy whole ingredients instead of prepackaged foods. Buy fresh fruits and vegetables in-season from local farmers markets. Buy frozen fruits and vegetables in resealable bags. If you do not have access to quality fresh  seafood, buy precooked frozen shrimp or canned fish, such as tuna, salmon, or sardines. Buy small amounts of raw or cooked vegetables, salads, or olives from the deli or salad bar at your store. Stock your pantry so you always have certain foods on hand, such as olive oil, canned tuna, canned tomatoes, rice, pasta, and beans. Cooking  Cook foods with extra-virgin olive oil instead of using butter or other vegetable oils. Have meat as a side dish, and have vegetables or grains as your main dish. This means having meat in small portions or adding small amounts of meat to foods like pasta or stew. Use beans or vegetables instead of meat in common dishes like chili or lasagna. Experiment with different cooking methods. Try roasting or broiling vegetables instead of steaming or sauteing them. Add frozen vegetables to soups, stews, pasta, or rice. Add nuts or seeds for added healthy fat at each meal. You can add these to yogurt, salads, or vegetable dishes. Marinate fish or vegetables using olive oil, lemon juice, garlic, and fresh herbs. Meal planning  Plan to eat 1 vegetarian meal one day each week. Try to work up to 2 vegetarian meals, if possible. Eat seafood 2 or more times a week. Have healthy snacks readily available, such as: Vegetable sticks with hummus. Greek yogurt. Fruit and nut trail mix. Eat balanced meals throughout the week. This includes: Fruit: 2-3 servings a day Vegetables: 4-5 servings a day Low-fat dairy: 2 servings a day Fish, poultry, or lean meat: 1 serving a day Beans and legumes: 2 or more servings a week Nuts and seeds: 1-2 servings a day Whole grains: 6-8 servings a day Extra-virgin olive oil: 3-4 servings a day Limit red meat and sweets to only a few servings a month What are my food choices? Mediterranean diet Recommended Grains: Whole-grain pasta. Brown rice. Bulgar wheat. Polenta. Couscous. Whole-wheat bread. [place] Made[place]. Vegetables: Artichokes. Beets.  Broccoli. Cabbage. Carrots. Eggplant. Green beans. Chard. Kale. Spinach. Onions. Leeks. Peas. Squash. Tomatoes. Peppers. Radishes. Fruits: Apples. Apricots. Avocado. Berries. Bananas. Cherries. Dates. Figs. Grapes. Lemons. Melon. Oranges. Peaches. Plums. Pomegranate. Meats and other protein foods: Beans. Almonds. Sunflower seeds. Pine nuts. Peanuts. Cod. Salmon. Scallops. Shrimp. Tuna. Tilapia. Clams. Oysters. Eggs. Dairy: Low-fat milk. Cheese. Greek yogurt. Beverages: Water. Red wine. Herbal tea. Fats and oils: Extra virgin olive oil. Avocado oil. Grape seed oil. Sweets and desserts: Greek yogurt with honey. Baked apples. Poached pears. Trail mix. Seasoning and other foods: Basil. Cilantro. Coriander. Cumin. Mint. Parsley. Sage. Rosemary. Tarragon. Garlic. Oregano. Thyme. Pepper. Balsalmic vinegar. Tahini. Hummus. Tomato sauce. Olives. Mushrooms. Limit these Grains: Prepackaged pasta or rice dishes. Prepackaged cereal with added sugar. Vegetables: Deep fried potatoes (french fries). Fruits: Fruit canned in syrup. Meats and other protein foods: Beef. Pork. Lamb. Poultry with skin. Hot dogs. [place]. Dairy: Ice cream. Sour cream. Whole milk. Beverages: Juice. Sugar-sweetened soft drinks. Beer. Liquor and spirits. Fats and oils: Butter. Canola oil. Vegetable oil. Beef fat (tallow). Lard. Sweets and desserts: Cookies. Cakes. Pies. Candy. Seasoning and  other foods: Mayonnaise. Premade sauces and marinades. The items listed may not be a complete list. Talk with your dietitian about what dietary choices are right for you. Summary The Mediterranean diet includes both food and lifestyle choices. Eat a variety of fresh fruits and vegetables, beans, nuts, seeds, and whole grains. Limit the amount of red meat and sweets that you eat. Talk with your health care provider about whether it is safe for you to drink red wine in moderation. This means 1 glass a day for nonpregnant women and 2 glasses a day for  men. A glass of wine equals 5 oz (150 mL). This information is not intended to replace advice given to you by your health care provider. Make sure you discuss any questions you have with your health care provider. Document Released: 02/15/2016 Document Revised: 03/19/2016 Document Reviewed: 02/15/2016 Elsevier Interactive Patient Education  2017 [employer].

## 2024-08-02 ENCOUNTER — Emergency Department (HOSPITAL_COMMUNITY)
Admission: EM | Admit: 2024-08-02 | Discharge: 2024-08-03 | Disposition: A | Attending: Emergency Medicine | Admitting: Emergency Medicine

## 2024-08-02 ENCOUNTER — Other Ambulatory Visit: Payer: Self-pay

## 2024-08-02 ENCOUNTER — Emergency Department (HOSPITAL_COMMUNITY)

## 2024-08-02 ENCOUNTER — Encounter (HOSPITAL_COMMUNITY): Payer: Self-pay

## 2024-08-02 DIAGNOSIS — R4689 Other symptoms and signs involving appearance and behavior: Secondary | ICD-10-CM | POA: Insufficient documentation

## 2024-08-02 DIAGNOSIS — R109 Unspecified abdominal pain: Secondary | ICD-10-CM | POA: Diagnosis not present

## 2024-08-02 DIAGNOSIS — Z91040 Latex allergy status: Secondary | ICD-10-CM | POA: Insufficient documentation

## 2024-08-02 DIAGNOSIS — R451 Restlessness and agitation: Secondary | ICD-10-CM | POA: Insufficient documentation

## 2024-08-02 DIAGNOSIS — F03918 Unspecified dementia, unspecified severity, with other behavioral disturbance: Secondary | ICD-10-CM | POA: Diagnosis not present

## 2024-08-02 DIAGNOSIS — Z7982 Long term (current) use of aspirin: Secondary | ICD-10-CM | POA: Diagnosis not present

## 2024-08-02 DIAGNOSIS — I700 Atherosclerosis of aorta: Secondary | ICD-10-CM | POA: Diagnosis not present

## 2024-08-02 DIAGNOSIS — K5730 Diverticulosis of large intestine without perforation or abscess without bleeding: Secondary | ICD-10-CM | POA: Insufficient documentation

## 2024-08-02 DIAGNOSIS — Z85828 Personal history of other malignant neoplasm of skin: Secondary | ICD-10-CM | POA: Insufficient documentation

## 2024-08-02 DIAGNOSIS — R4182 Altered mental status, unspecified: Secondary | ICD-10-CM

## 2024-08-02 DIAGNOSIS — R2689 Other abnormalities of gait and mobility: Secondary | ICD-10-CM | POA: Insufficient documentation

## 2024-08-02 DIAGNOSIS — F411 Generalized anxiety disorder: Secondary | ICD-10-CM | POA: Diagnosis not present

## 2024-08-02 DIAGNOSIS — F03C18 Unspecified dementia, severe, with other behavioral disturbance: Secondary | ICD-10-CM

## 2024-08-02 DIAGNOSIS — E782 Mixed hyperlipidemia: Secondary | ICD-10-CM | POA: Diagnosis not present

## 2024-08-02 LAB — CBC WITH DIFFERENTIAL/PLATELET
Abs Immature Granulocytes: 0.01 10*3/uL (ref 0.00–0.07)
Basophils Absolute: 0 10*3/uL (ref 0.0–0.1)
Basophils Relative: 1 %
Eosinophils Absolute: 0 10*3/uL (ref 0.0–0.5)
Eosinophils Relative: 0 %
HCT: 42.6 % (ref 39.0–52.0)
Hemoglobin: 14 g/dL (ref 13.0–17.0)
Immature Granulocytes: 0 %
Lymphocytes Relative: 22 %
Lymphs Abs: 1.1 10*3/uL (ref 0.7–4.0)
MCH: 30 pg (ref 26.0–34.0)
MCHC: 32.9 g/dL (ref 30.0–36.0)
MCV: 91.2 fL (ref 80.0–100.0)
Monocytes Absolute: 0.4 10*3/uL (ref 0.1–1.0)
Monocytes Relative: 8 %
Neutro Abs: 3.3 10*3/uL (ref 1.7–7.7)
Neutrophils Relative %: 69 %
Platelets: 130 10*3/uL — ABNORMAL LOW (ref 150–400)
RBC: 4.67 MIL/uL (ref 4.22–5.81)
RDW: 13.2 % (ref 11.5–15.5)
WBC: 4.9 10*3/uL (ref 4.0–10.5)
nRBC: 0 % (ref 0.0–0.2)

## 2024-08-02 LAB — URINALYSIS, W/ REFLEX TO CULTURE (INFECTION SUSPECTED)
Bacteria, UA: NONE SEEN
Bilirubin Urine: NEGATIVE
Glucose, UA: NEGATIVE mg/dL
Hgb urine dipstick: NEGATIVE
Ketones, ur: NEGATIVE mg/dL
Leukocytes,Ua: NEGATIVE
Nitrite: NEGATIVE
Protein, ur: NEGATIVE mg/dL
Specific Gravity, Urine: 1.041 — ABNORMAL HIGH (ref 1.005–1.030)
pH: 5 (ref 5.0–8.0)

## 2024-08-02 LAB — COMPREHENSIVE METABOLIC PANEL WITH GFR
ALT: 9 U/L (ref 0–44)
AST: 26 U/L (ref 15–41)
Albumin: 4 g/dL (ref 3.5–5.0)
Alkaline Phosphatase: 71 U/L (ref 38–126)
Anion gap: 8 (ref 5–15)
BUN: 7 mg/dL — ABNORMAL LOW (ref 8–23)
CO2: 27 mmol/L (ref 22–32)
Calcium: 8.7 mg/dL — ABNORMAL LOW (ref 8.9–10.3)
Chloride: 108 mmol/L (ref 98–111)
Creatinine, Ser: 0.75 mg/dL (ref 0.61–1.24)
GFR, Estimated: >60 mL/min
Glucose, Bld: 115 mg/dL — ABNORMAL HIGH (ref 70–99)
Potassium: 4.7 mmol/L (ref 3.5–5.1)
Sodium: 143 mmol/L (ref 135–145)
Total Bilirubin: 0.2 mg/dL (ref 0.0–1.2)
Total Protein: 6.7 g/dL (ref 6.5–8.1)

## 2024-08-02 LAB — LIPASE, BLOOD: Lipase: 33 U/L (ref 11–51)

## 2024-08-02 LAB — CBG MONITORING, ED: Glucose-Capillary: 97 mg/dL (ref 70–99)

## 2024-08-02 MED ORDER — HALOPERIDOL LACTATE 5 MG/ML IJ SOLN
5.0000 mg | Freq: Once | INTRAMUSCULAR | Status: AC
  Administered 2024-08-02: 5 mg via INTRAMUSCULAR
  Filled 2024-08-02: qty 1

## 2024-08-02 MED ORDER — DONEPEZIL HCL 5 MG PO TABS
10.0000 mg | ORAL_TABLET | Freq: Every day | ORAL | Status: DC
  Administered 2024-08-03: 10 mg via ORAL
  Filled 2024-08-02: qty 2

## 2024-08-02 MED ORDER — ASPIRIN 81 MG PO TBEC
81.0000 mg | DELAYED_RELEASE_TABLET | Freq: Every day | ORAL | Status: DC
  Administered 2024-08-03: 81 mg via ORAL
  Filled 2024-08-02: qty 1

## 2024-08-02 MED ORDER — SODIUM CHLORIDE 0.9 % IV BOLUS
1000.0000 mL | Freq: Once | INTRAVENOUS | Status: AC
  Administered 2024-08-02: 1000 mL via INTRAVENOUS

## 2024-08-02 MED ORDER — MELATONIN 3 MG PO TABS
1.5000 mg | ORAL_TABLET | Freq: Every day | ORAL | Status: DC
  Administered 2024-08-03: 1.5 mg via ORAL
  Filled 2024-08-02: qty 1

## 2024-08-02 MED ORDER — IOHEXOL 300 MG/ML  SOLN
100.0000 mL | Freq: Once | INTRAMUSCULAR | Status: AC | PRN
  Administered 2024-08-02: 100 mL via INTRAVENOUS

## 2024-08-02 MED ORDER — MEMANTINE HCL 5 MG PO TABS
5.0000 mg | ORAL_TABLET | Freq: Two times a day (BID) | ORAL | Status: DC
  Administered 2024-08-03 (×2): 5 mg via ORAL
  Filled 2024-08-02 (×2): qty 1

## 2024-08-02 MED ORDER — ALPRAZOLAM 0.5 MG PO TABS
1.0000 mg | ORAL_TABLET | Freq: Two times a day (BID) | ORAL | Status: DC | PRN
  Administered 2024-08-03 (×2): 1 mg via ORAL
  Filled 2024-08-02 (×2): qty 2

## 2024-08-02 MED ORDER — ROSUVASTATIN CALCIUM 20 MG PO TABS
10.0000 mg | ORAL_TABLET | Freq: Every day | ORAL | Status: DC
  Administered 2024-08-03: 10 mg via ORAL
  Filled 2024-08-02: qty 1

## 2024-08-02 NOTE — ED Notes (Addendum)
 This clinical research associate was gathering supplies for patient at nurse desk and was walking towards room when heard a loud thud. Turned the corner into the room and noticed patient on the floor with a puddle of urine next to the patient. Patient on his left side with arms up holding his head. There was noticeable bleeding on his hand and upon looking there was a skin abrasion on the right side of forehead. With the help of another staff member we got the patient off the floor and onto the bed. Gauze was applied to abrasion on forehead. MD was notified of same.

## 2024-08-02 NOTE — Consult Note (Signed)
 Initial Consultation Note   Patient: Seth Bryant FMW:[id number] DOB: 06/12/1946 PCP: Norleen Lynwood ORN, MD DOA: 08/02/2024 DOS: the patient was seen and examined on 08/02/2024 Primary service: Lenor Hollering, MD  Referring physician: Lenor Hollering, MD Reason for consult: Altered mental status  Assessment/Plan: Assessment and Plan:  # Altered mental status Patient with dementia presented with worsening agitation and recent behavior changes. There is a concern for possible delirium by EDP however workup so far for cause has been unrevealing with no evidence of structural abnormality, metabolic derangements or infection. After further discussions with family and assessment at bedside, patient's presentation more consistent with progression of his dementia. Per his recent outpatient neurology evaluation, patient has had recent progression of his dementia with erratic driving and worsening memory. Patient was not taking his donepezil  for unknown reasons, resume by neurologist at that visit. Patien's 31 year old mother expressed that she will be unable to care for patient with his current behavioral changes and progression of his dementia. I had extensive discussion with patient's mother and son at bedside that patient's behavior changes are likely not due to an acute process but likely progression of his dementia.  He would benefit from likely placement with the assistance of transition of care team if they are unable to care for him appropriately at home. They are in agreement of this plan.   Plan - TOC consulted to assist with placement versus home assistance such as home health aide/RN - Continue soft restraints, can release when more calm - Check TSH and vitamin B12 levels - Delirium precautions  # Mixed dementia - Per recent outpatient neurology notes, thought to be possibly Alzheimer's disease likely exacerbated by prior alcohol abuse and dependence) - Continue Aricept  and memantine   #  GAD - Continue as needed Xanax   # Insomnia - Continue home melatonin  # HLD - Continue rosuvastatin    TRH will continue to follow the patient.  HPI: Seth Bryant is a 79 y.o. male with past medical history of mixed dementia, HTN, HLD, anxiety, B12 deficiency, BPPV, chronic pain syndrome, spinal stenosis, benzodiazepine dependence and previous alcohol use disorder who presented to the ED for evaluation of altered mental status. Per mother and son, patient has episodes of intermittent agitation with his dementia but they are usually short-lived. Patient's 61 year old mother reports that patient currently lives with her and she does her best to care for patient. Over the last few days, patient has become increasingly agitated. Today, patient became very agitated with bizarre behavior such as cursing and being combative. She does not think she can handle patient with this behavior changes so she called EMS for evaluation. On the arrival, patient was found to be combative and was given 2 mg of Versed prior to transfer. Per mother, patient has not had any recent illnesses. At the bedside, patient in soft bilateral wrist restraints but very fidgety, restless with repetitive use of offensive language (curse words) told provider to remove the restraint. Patient became calm during the encounter and was directable, answering questions, occasionally making jokes and saying again. He is able to tell me his full name, DOB and current location but not the year/month. He endorses chronic back pain and asked to be covered due to feeling cold.  Review of Systems: As mentioned in the history of present illness. All other systems reviewed and are negative. 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   Age-related nuclear cataract  of both eyes 11/10/2014   Alcoholism 11/07/2014   Anesthesia of skin 09/19/2005   Arthralgia of multiple sites  01/28/2008   Benign neoplasm of large intestine 05/23/2008   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   14Mar2022: Patient continues with alprazolam  1 mg he takes 1 at night-pt counselled re the safety concern with  this medicaion use in combination with ETOHa nd pain meds- pt and wife aware that high risk with these meds for injury- fall, addiction, and even possible death from respiratory supression.- new contract signed by pt with wife present; Impression - 26May2022: Patient continu   Cervical spondylosis 11/26/2006   Chronic pain 09/27/2016   Digital nerve laceration, finger 11/12/2017   Diverticulosis of colon 10/09/2015   Drug-induced photosensitivity 09/23/2019   Elevated PSA 03/04/2019   26May2022: Patient's PSA is up to 8-pending surgical procedure for prostate biopsies with Dr. Donah today's visit is for preop clearance   Fatigue 10/14/2008   01May2023: Fatigue likely associated with low testosterone levels recent treatment of prostate cancer increasing age decreasing activity levels   Flexor tendon rupture of hand 11/12/2017   Frequent falls 04/25/2022   19Oct2023: The etiology of the falls is unclear but per the wife likely associated with alcohol intake and patient losing his footing-there is been no loss of consciousness-ER evaluation with head CT scan was negative counseled about drinking and using an assistive walking device could be helpful   Generalized anxiety disorder 06/05/2023   Insomnia 04/01/2014   Internal hemorrhoid 10/09/2015   Lateral epicondylitis 06/14/2009   Lumbar spondylosis 11/04/2005   01May2023: Chronic ongoing lower back pain intermittent patient uses Vicodin and/or Tylenol  sometimes nonsteroidal anti-inflammatories for discomfort   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   Multiple lacunar infarcts    Neck pain 11/26/2006   Neoplasm of uncertain behavior of skin 07/18/2014   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   14Mar2022: as above- pt taking hydrocodone  prn- not taking  every day; Impression - 09Feb2023: Patient taking not taking every day cures done today no aberrant behavior; Impression - 01May2023: Patient taking not taking every day cures done today no aberrant behavior-strongly encourage patient to wean off of this medication Tylenol  only as needed new contract has been established   Polymyalgia rheumatica 06/01/2008   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 He has never used smokeless tobacco. He reports that he does not currently use alcohol. He reports that he does not currently use drugs.  Allergies[1]  Family History  Problem Relation Age of Onset   Heart failure Father     Prior to Admission medications  Medication Sig Start Date End Date Taking? Authorizing Provider  ALPRAZolam  (XANAX ) 1 MG tablet TAKE 1 TABLET BY MOUTH TWICE A DAY AS NEEDED FOR ANXIETY 05/11/24   Norleen Lynwood ORN, MD  aspirin  EC 81 MG tablet Take 1 tablet (81 mg total) by mouth daily. Swallow whole. 06/30/23   Norleen Lynwood ORN, MD  donepezil  (ARICEPT ) 10 MG tablet Take 1 tablet (10 mg total) by mouth at bedtime. 07/30/24   Wertman, Sara  E, PA-C  fluticasone (FLONASE) 50 MCG/ACT nasal spray Place 2 sprays into both nostrils daily. 11/07/14   [provider]  Melatonin 1 MG CAPS Take 1 mg by mouth at bedtime.    [provider]  memantine  (NAMENDA ) 10 MG tablet Take 1 tablet  twice a day 07/30/24   Wertman, Sara E, PA-C  rosuvastatin  (CRESTOR ) 10 MG tablet Take 1 tablet (10 mg total) by mouth daily. 06/09/24   Norleen Lynwood ORN, MD  sildenafil  (VIAGRA ) 100 MG  tablet Take 0.5-1 tablets (50-100 mg total) by mouth daily as needed for erectile dysfunction. 11/11/23   Norleen Lynwood ORN, MD  triamcinolone cream (KENALOG) 0.1 % Apply 1 applicator topically. Patient not taking: Reported on 07/28/2024 02/11/19   [provider]    Physical Exam: Vitals:   08/02/24 1934 08/02/24 2130 08/02/24 2200 08/02/24 2230  BP: 132/69 102/66 109/74 106/68  Pulse: (!) 54 63 64 (!) 56  Resp: 18 14 10 13   Temp:      TempSrc:      SpO2: 99% 100% 100% 100%   General: Agitated elderly man laying in bed with soft wrist restraints.  HEENT: Small abrasion on right forehead covered with dressing. Anicteric sclera CV: RRR. No murmurs, rubs, or gallops. No LE edema Pulmonary: Lungs CTAB. Normal effort. No wheezing or rales. Abdominal: Soft, nontender, nondistended. Normal bowel sounds. Extremities: Palpable radial and DP pulses. Normal ROM. Skin: Warm and dry. No obvious rash or lesions. Neuro: Alert and oriented to self, person and place but not to time or situation. Moves all extremities. Normal sensation to light touch. No focal deficit. Psych: Agitated but intermittently calm  Data Reviewed:  CT Head Wo Contrast Result Date: 08/02/2024 CLINICAL DATA:  Mental status change EXAM: CT HEAD WITHOUT CONTRAST TECHNIQUE: Contiguous axial images were obtained from the base of the skull through the vertex without intravenous contrast. RADIATION DOSE REDUCTION: This exam was performed according to the departmental dose-optimization program which includes automated exposure control, adjustment of the mA and/or kV according to patient size and/or use of iterative reconstruction technique. COMPARISON:  Head CT 05/06/2019, MRI 06/29/2023 FINDINGS: Brain: No acute territorial infarction, hemorrhage or intracranial mass. Atrophy and mild chronic small vessel ischemic changes of the white matter. Chronic lacunar infarcts in the left basal ganglia. Stable ventricle size. Vascular: No  hyperdense vessels.  No unexpected calcification Skull: Normal. Negative for fracture or focal lesion. Sinuses/Orbits: Patchy mucosal thickening in the sinuses Other: None IMPRESSION: 1. No CT evidence for acute intracranial abnormality. 2. Atrophy and chronic small vessel ischemic changes of the white matter. Chronic lacunar infarcts in the left basal ganglia. Electronically Signed   By: Luke Bun M.D.   On: 08/02/2024 21:05   CT Cervical Spine Wo Contrast Result Date: 08/02/2024 CLINICAL DATA:  Trauma EXAM: CT CERVICAL SPINE WITHOUT CONTRAST TECHNIQUE: Multidetector CT imaging of the cervical spine was performed without intravenous contrast. Multiplanar CT image reconstructions were also generated. RADIATION DOSE REDUCTION: This exam was performed according to the departmental dose-optimization program which includes automated exposure control, adjustment of the mA and/or kV according to patient size and/or use of iterative reconstruction technique. COMPARISON:  CT 04/19/2023 FINDINGS: Alignment: Trace retrolisthesis C3 on C4. Facet alignment is maintained. Skull base and vertebrae: No acute fracture. No primary bone lesion or focal pathologic process. Soft tissues and spinal canal: No prevertebral fluid or swelling. No visible canal hematoma. Disc levels: Multilevel degenerative change. Partial interbody ankylosis C5-C6. Advanced disc space narrowing C6-C7  and C7-T1. Multilevel facet degenerative change with foraminal narrowing. Upper chest: Lung apices are clear. Bilateral hypodense thyroid  nodules. This has been evaluated on previous imaging. (ref: J Am Coll Radiol. 2015 Feb;12(2): 143-50). Other: None IMPRESSION: 1. Trace retrolisthesis C3 on C4, likely degenerative. No acute osseous abnormality. 2. Multilevel degenerative changes. Electronically Signed   By: Luke Bun M.D.   On: 08/02/2024 21:03   CT ABDOMEN PELVIS W CONTRAST Result Date: 08/02/2024 CLINICAL DATA:  Abdomen pain EXAM: CT ABDOMEN  AND PELVIS WITH CONTRAST TECHNIQUE: Multidetector CT imaging of the abdomen and pelvis was performed using the standard protocol following bolus administration of intravenous contrast. RADIATION DOSE REDUCTION: This exam was performed according to the departmental dose-optimization program which includes automated exposure control, adjustment of the mA and/or kV according to patient size and/or use of iterative reconstruction technique. CONTRAST:  OMNIPAQUE  IOHEXOL  300 MG/ML  SOLN COMPARISON:  None Available. FINDINGS: Lower chest: Lung bases demonstrate no acute airspace disease. Calcified granulomas. Coronary vascular calcification. Hepatobiliary: No focal liver abnormality is seen. No gallstones, gallbladder wall thickening, or biliary dilatation. Pancreas: Unremarkable. No pancreatic ductal dilatation or surrounding inflammatory changes. Spleen: Normal in size without focal abnormality. Adrenals/Urinary Tract: Adrenal glands are normal. Kidneys show no hydronephrosis. The bladder is unremarkable Stomach/Bowel: The stomach is within normal limits. No dilated small bowel. No acute bowel wall thickening. Negative appendix. Diverticular disease of the left colon. Vascular/Lymphatic: Aortic atherosclerosis. No enlarged abdominal or pelvic lymph nodes. Retroaortic left renal vein. Reproductive: Negative prostate Other: No ascites or free air Musculoskeletal: Degenerative changes.  No acute osseous abnormality IMPRESSION: 1. No CT evidence for acute intra-abdominal or pelvic abnormality. 2. Diverticular disease of the left colon without acute inflammatory process. 3. Aortic atherosclerosis. Aortic Atherosclerosis (ICD10-I70.0). Electronically Signed   By: Luke Bun M.D.   On: 08/02/2024 20:57   CMP and CBC unremarkable, normal blood glucose, ammonia minimally elevated to 45, UA with no signs of infection    Family Communication: Plan discussed with mother and son at bedside Primary team communication:  Plan relayed to ED provider, Dr. Lenor Thank you very much for involving us  in the care of your patient.  I personally spent a total of 45 minutes in the care of the patient today including preparing to see the patient, getting/reviewing separately obtained history, performing a medically appropriate exam/evaluation, counseling and educating, placing orders, referring and communicating with other health care professionals, documenting clinical information in the EHR, and communicating results.  Author: Claretta CHRISTELLA Alderman, MD 08/02/2024 11:10 PM  For on call review [email or web address].      [1]  Allergies Allergen Reactions   Latex

## 2024-08-02 NOTE — ED Provider Notes (Signed)
 " [place] EMERGENCY DEPARTMENT AT [facility] Provider Note   CSN: [id number] Arrival date & time: 08/02/24  1850     Patient presents with: Altered Mental Status   Cavin Longman is a 79 y.o. male.   Patient is a 79 year old male with a history of dementia, prior alcohol use disorder, chronic pain, polymyalgia rheumatica who presents with altered mental status.  History is obtained by EMS who states that the family reported that patient has been increasingly agitated over the last couple days and more confused than normal.  They are concerned that he may have a UTI.  Typically he has a calm and pleasant demeanor per EMS report.  He is typically not agitated.  Per chart, patient lives with his 7 year old mother.  I attempted to call the contact number to get more information but no one answered.       Prior to Admission medications  Medication Sig Start Date End Date Taking? Authorizing Provider  ALPRAZolam  (XANAX ) 1 MG tablet TAKE 1 TABLET BY MOUTH TWICE A DAY AS NEEDED FOR ANXIETY 05/11/24   Norleen Lynwood ORN, MD  aspirin  EC 81 MG tablet Take 1 tablet (81 mg total) by mouth daily. Swallow whole. 06/30/23   Norleen Lynwood ORN, MD  donepezil  (ARICEPT ) 10 MG tablet Take 1 tablet (10 mg total) by mouth at bedtime. 07/30/24   Wertman, Sara E, PA-C  fluticasone (FLONASE) 50 MCG/ACT nasal spray Place 2 sprays into both nostrils daily. 11/07/14   [provider]  Melatonin 1 MG CAPS Take 1 mg by mouth at bedtime.    [provider]  memantine  (NAMENDA ) 10 MG tablet Take 1 tablet  twice a day 07/30/24   Wertman, Sara E, PA-C  rosuvastatin  (CRESTOR ) 10 MG tablet Take 1 tablet (10 mg total) by mouth daily. 06/09/24   Norleen Lynwood ORN, MD  sildenafil  (VIAGRA ) 100 MG tablet Take 0.5-1 tablets (50-100 mg total) by mouth daily as needed for erectile dysfunction. 11/11/23   Norleen Lynwood ORN, MD  triamcinolone cream (KENALOG) 0.1 % Apply 1 applicator topically. Patient not taking: Reported  on 07/28/2024 02/11/19   [provider]    Allergies: Latex    Review of Systems  Unable to perform ROS: Mental status change    Updated Vital Signs BP 132/69 (BP Location: Right Arm)   Pulse (!) 54   Temp (!) 96.4 F (35.8 C) (Axillary)   Resp 18   SpO2 99%   Physical Exam Constitutional:      Appearance: He is well-developed.     Comments: Fidgety  HENT:     Head: Normocephalic and atraumatic.  Eyes:     Pupils: Pupils are equal, round, and reactive to light.  Cardiovascular:     Rate and Rhythm: Normal rate and regular rhythm.     Heart sounds: Normal heart sounds.  Pulmonary:     Effort: Pulmonary effort is normal. No respiratory distress.     Breath sounds: Normal breath sounds. No wheezing or rales.  Chest:     Chest wall: No tenderness.  Abdominal:     General: Bowel sounds are normal.     Palpations: Abdomen is soft.     Tenderness: There is no abdominal tenderness. There is no guarding or rebound.  Musculoskeletal:        General: Normal range of motion.     Cervical back: Normal range of motion and neck supple.  Lymphadenopathy:     Cervical: No  cervical adenopathy.  Skin:    General: Skin is warm and dry.     Findings: No rash.  Neurological:     General: No focal deficit present.     Mental Status: He is alert.     Comments: Patient looks at me and will try to answer questions but is not answering appropriately.  He seems to be moving all extremities without focal deficits.  He is not following commands.     (all labs ordered are listed, but only abnormal results are displayed) Labs Reviewed  COMPREHENSIVE METABOLIC PANEL WITH GFR - Abnormal; Notable for the following components:      Result Value   Glucose, Bld 115 (*)    BUN 7 (*)    Calcium  8.7 (*)    All other components within normal limits  CBC WITH DIFFERENTIAL/PLATELET - Abnormal; Notable for the following components:   Platelets 130 (*)    All other components within normal  limits  URINALYSIS, W/ REFLEX TO CULTURE (INFECTION SUSPECTED) - Abnormal; Notable for the following components:   Color, Urine STRAW (*)    Specific Gravity, Urine 1.041 (*)    All other components within normal limits  LIPASE, BLOOD  AMMONIA  CBG MONITORING, ED    EKG: EKG Interpretation Date/Time:  Monday August 02 2024 20:02:42 EST Ventricular Rate:  55 PR Interval:  343 QRS Duration:  85 QT Interval:  444 QTC Calculation: 425 R Axis:   68  Text Interpretation: Sinus or ectopic atrial rhythm Prolonged PR interval Borderline low voltage, extremity leads No old tracing to compare Confirmed by Lenor Hollering [phone]) on 08/02/2024 10:07:39 PM  Radiology: CT Head Wo Contrast Result Date: 08/02/2024 CLINICAL DATA:  Mental status change EXAM: CT HEAD WITHOUT CONTRAST TECHNIQUE: Contiguous axial images were obtained from the base of the skull through the vertex without intravenous contrast. RADIATION DOSE REDUCTION: This exam was performed according to the departmental dose-optimization program which includes automated exposure control, adjustment of the mA and/or kV according to patient size and/or use of iterative reconstruction technique. COMPARISON:  Head CT 05/06/2019, MRI 06/29/2023 FINDINGS: Brain: No acute territorial infarction, hemorrhage or intracranial mass. Atrophy and mild chronic small vessel ischemic changes of the white matter. Chronic lacunar infarcts in the left basal ganglia. Stable ventricle size. Vascular: No hyperdense vessels.  No unexpected calcification Skull: Normal. Negative for fracture or focal lesion. Sinuses/Orbits: Patchy mucosal thickening in the sinuses Other: None IMPRESSION: 1. No CT evidence for acute intracranial abnormality. 2. Atrophy and chronic small vessel ischemic changes of the white matter. Chronic lacunar infarcts in the left basal ganglia. Electronically Signed   By: Luke Bun M.D.   On: 08/02/2024 21:05   CT Cervical Spine Wo  Contrast Result Date: 08/02/2024 CLINICAL DATA:  Trauma EXAM: CT CERVICAL SPINE WITHOUT CONTRAST TECHNIQUE: Multidetector CT imaging of the cervical spine was performed without intravenous contrast. Multiplanar CT image reconstructions were also generated. RADIATION DOSE REDUCTION: This exam was performed according to the departmental dose-optimization program which includes automated exposure control, adjustment of the mA and/or kV according to patient size and/or use of iterative reconstruction technique. COMPARISON:  CT 04/19/2023 FINDINGS: Alignment: Trace retrolisthesis C3 on C4. Facet alignment is maintained. Skull base and vertebrae: No acute fracture. No primary bone lesion or focal pathologic process. Soft tissues and spinal canal: No prevertebral fluid or swelling. No visible canal hematoma. Disc levels: Multilevel degenerative change. Partial interbody ankylosis C5-C6. Advanced disc space narrowing C6-C7 and C7-T1. Multilevel facet degenerative  change with foraminal narrowing. Upper chest: Lung apices are clear. Bilateral hypodense thyroid  nodules. This has been evaluated on previous imaging. (ref: J Am Coll Radiol. 2015 Feb;12(2): 143-50). Other: None IMPRESSION: 1. Trace retrolisthesis C3 on C4, likely degenerative. No acute osseous abnormality. 2. Multilevel degenerative changes. Electronically Signed   By: Luke Bun M.D.   On: 08/02/2024 21:03   CT ABDOMEN PELVIS W CONTRAST Result Date: 08/02/2024 CLINICAL DATA:  Abdomen pain EXAM: CT ABDOMEN AND PELVIS WITH CONTRAST TECHNIQUE: Multidetector CT imaging of the abdomen and pelvis was performed using the standard protocol following bolus administration of intravenous contrast. RADIATION DOSE REDUCTION: This exam was performed according to the departmental dose-optimization program which includes automated exposure control, adjustment of the mA and/or kV according to patient size and/or use of iterative reconstruction technique. CONTRAST:   OMNIPAQUE  IOHEXOL  300 MG/ML  SOLN COMPARISON:  None Available. FINDINGS: Lower chest: Lung bases demonstrate no acute airspace disease. Calcified granulomas. Coronary vascular calcification. Hepatobiliary: No focal liver abnormality is seen. No gallstones, gallbladder wall thickening, or biliary dilatation. Pancreas: Unremarkable. No pancreatic ductal dilatation or surrounding inflammatory changes. Spleen: Normal in size without focal abnormality. Adrenals/Urinary Tract: Adrenal glands are normal. Kidneys show no hydronephrosis. The bladder is unremarkable Stomach/Bowel: The stomach is within normal limits. No dilated small bowel. No acute bowel wall thickening. Negative appendix. Diverticular disease of the left colon. Vascular/Lymphatic: Aortic atherosclerosis. No enlarged abdominal or pelvic lymph nodes. Retroaortic left renal vein. Reproductive: Negative prostate Other: No ascites or free air Musculoskeletal: Degenerative changes.  No acute osseous abnormality IMPRESSION: 1. No CT evidence for acute intra-abdominal or pelvic abnormality. 2. Diverticular disease of the left colon without acute inflammatory process. 3. Aortic atherosclerosis. Aortic Atherosclerosis (ICD10-I70.0). Electronically Signed   By: Luke Bun M.D.   On: 08/02/2024 20:57     Procedures   Medications Ordered in the ED  haloperidol  lactate (HALDOL ) injection 5 mg (5 mg Intramuscular Given 08/02/24 2001)  iohexol  (OMNIPAQUE ) 300 MG/ML solution 100 mL (100 mLs Intravenous Contrast Given 08/02/24 2022)  sodium chloride  0.9 % bolus 1,000 mL (1,000 mLs Intravenous New Bag/Given 08/02/24 2143)                                    Medical Decision Making Amount and/or Complexity of Data Reviewed Labs: ordered. Radiology: ordered.  Risk OTC drugs. Prescription drug management.   This patient presents to the ED for concern of confusion, agitation, this involves an extensive number of treatment options, and is a complaint that  carries with it a high risk of complications and morbidity.  I considered the following differential and admission for this acute, potentially life threatening condition.  The differential diagnosis includes intracranial hemorrhage, advancing dementia, electrolyte abnormality, anemia, stroke, meningitis, encephalopathy  MDM:    Patient is a 79 year old male who presents with increased agitation and confusion.  Family showed up.  He lives with his 11 year old mother.  She states that he has had some increasing agitation and belligerent behavior today.  He has done that in the past but not quite this bad.  She was concerned that he may have an infection.  She said that normally most the time people would not even realize he has dementia.  On my initial exam, he was moving his arms around and was fidgety and was not answering questions or following commands.  His head CT does not show any acute  abnormality.  Labs are nonconcerning.  Urine is not concerning for infection.  No fever.  He was very agitated on arrival and actually just before the nurse went in the room, he got out of the bed and fell forward hitting his head on the floor.  This was prior to having the CT done.  For this reason a CT cervical spine was also done which showed no acute abnormality.  He has a small abrasion to his forehead but no suturable wounds.  No other apparent injuries.  He did have some abdominal pain on my exam.  CT abdomen pelvis does not show any acute abnormality.  When I initially saw the patient, he did seem to have a little bit of a degree of delirium.  However currently he seems to be answering questions and more back to his baseline although intermittently agitated.  His work appears been nonrevealing.  Discussed with the hospitalist whether he would meet criteria for admission for delirium.  He did come down and evaluate the patient and thinks that this is more consistent with progressive dementia.  In the hospitalist  discussion with the patient's family, it was determined that they would not be able to care for the patient at home and it would be better to work toward placement.  The hospitalist has put in a social work consult.  I have ordered home meds for the patient.  (Labs, imaging, consults)  Labs: I Ordered, and personally interpreted labs.  The pertinent results include: Urine not concerning for infection, CBC shows a normal white count, mildly low platelet count but similar to prior values.  Electrolytes are nonconcerning, LFTs normal, lipase normal.  Imaging Studies ordered: I ordered imaging studies including CT head, CT cervical spine, CT abdomen pelvis I independently visualized and interpreted imaging. I agree with the radiologist interpretation  Additional history obtained from mother at bedside.  External records from outside source obtained and reviewed including prior notes  Cardiac Monitoring: The patient was maintained on a cardiac monitor.  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   14Mar2022: Patient continues with alprazolam  1 mg he takes 1 at night-pt counselled re the safety concern with  this medicaion use in combination with ETOHa nd pain meds- pt and wife aware that high risk with these meds for injury-  fall, addiction, and even possible death from respiratory supression.- new contract signed by pt with wife present; Impression - 26May2022: Patient continu   Cervical spondylosis 11/26/2006   Chronic pain 09/27/2016   Digital nerve laceration, finger 11/12/2017   Diverticulosis of colon 10/09/2015   Drug-induced photosensitivity 09/23/2019   Elevated PSA 03/04/2019   26May2022: Patient's PSA is up to 8-pending surgical procedure for prostate biopsies with Dr. Donah today's visit is for preop clearance   Fatigue 10/14/2008   01May2023: Fatigue likely associated with low testosterone levels recent treatment of prostate  cancer increasing age decreasing activity levels   Flexor tendon rupture of hand 11/12/2017   Frequent falls 04/25/2022   19Oct2023: The etiology of the falls is unclear but per the wife likely associated with alcohol intake and patient losing his footing-there is been no loss of consciousness-ER evaluation with head CT scan was negative counseled about drinking and using an assistive walking device could be helpful   Generalized anxiety disorder 06/05/2023   Insomnia 04/01/2014   Internal hemorrhoid 10/09/2015   Lateral epicondylitis 06/14/2009   Lumbar spondylosis 11/04/2005   01May2023: Chronic ongoing lower back pain intermittent patient uses Vicodin and/or Tylenol  sometimes nonsteroidal anti-inflammatories for discomfort   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   Multiple lacunar infarcts    Neck pain 11/26/2006   Neoplasm of uncertain behavior of skin 07/18/2014   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   14Mar2022: as above- pt taking hydrocodone  prn- not taking every day; Impression - 09Feb2023: Patient taking not taking every day cures done today no aberrant behavior; Impression - 01May2023: Patient taking not taking every day cures done today no aberrant behavior-strongly encourage patient to wean off of this medication Tylenol  only as needed new contract has been established   Polymyalgia  rheumatica 06/01/2008   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   Senile purpura 12/19/2022   Skin cancer 03/07/2008   Somatic dysfunction of cervical region 11/26/2006   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   Thrombocytopenia 12/19/2022   Thyroid  nodule 06/05/2023   Vitamin B12 deficiency 03/31/2023     Medicines Meds ordered this encounter  Medications   haloperidol  lactate (HALDOL ) injection 5 mg   iohexol  (OMNIPAQUE ) 300 MG/ML solution 100 mL   sodium chloride  0.9 % bolus 1,000 mL    I have reviewed the patients home medicines and have made adjustments as needed  Problem List / ED Course: Problem List Items Addressed This Visit   None            Final diagnoses:  None    ED Discharge Orders     None          Lenor Hollering, MD 08/02/24 2312  "

## 2024-08-02 NOTE — ED Notes (Addendum)
 Patient was becoming more verbal abusive and pulling at line while trying to hook patient onto monitor. Order for soft restraints was placed by MD and notified her of having to place bilateral wrist instead of the belt due to trying to hit staff. Soft mittens was placed as well to help with pulling cables off. During doing this noticed patient had a bump started appear on forehead were he hit his heat. Bruising noted and bleeding was still controlled on abrasion.  Patient was given more medication to help calm him down. Family was present in the room while this was occurring and was updated on patients fall and why the mittens and restraints was in place. Family was understanding of same.

## 2024-08-02 NOTE — ED Triage Notes (Signed)
 Pt BIB EMS from home for bizarre behavior and agitation. Pt is combative as well and was given 2mg  of Versed. Pt stated he needed to urinate but had no output. Pt is also hot to touch per EMS.  A&Ox0 per EMS  Hx dementia  18g LAC

## 2024-08-03 DIAGNOSIS — F03918 Unspecified dementia, unspecified severity, with other behavioral disturbance: Secondary | ICD-10-CM

## 2024-08-03 DIAGNOSIS — R4182 Altered mental status, unspecified: Secondary | ICD-10-CM

## 2024-08-03 LAB — AMMONIA: Ammonia: 45 umol/L — ABNORMAL HIGH (ref 9–35)

## 2024-08-03 LAB — VITAMIN B12: Vitamin B-12: 965 pg/mL — ABNORMAL HIGH (ref 180–914)

## 2024-08-03 LAB — TSH: TSH: 0.84 u[IU]/mL (ref 0.350–4.500)

## 2024-08-03 MED ORDER — ACETAMINOPHEN 325 MG PO TABS
650.0000 mg | ORAL_TABLET | Freq: Once | ORAL | Status: AC
  Administered 2024-08-03: 650 mg via ORAL
  Filled 2024-08-03: qty 2

## 2024-08-03 NOTE — ED Notes (Signed)
 Patient is more alert now and aware of surroundings. Patient asked if he could turn on his side. This clinical research associate took off the left wrist safety cuff and soft mitten so patient could roll over. Patient has been complaint for a while and trailing slowly out of restraints. Patient was covered up and had no needs expressed at this time. Bed alarm is on and right wrist safety cuff still in place.

## 2024-08-03 NOTE — ED Notes (Signed)
 Patient requested something to knock me out and then asked where jeans and shoes are stating he wants to leave.  Reoriented patient and repositioned with pillow behind back to make him more comfortable.  Bed alarm is in place and working.

## 2024-08-03 NOTE — ED Notes (Signed)
 Patient placed on bedpan.

## 2024-08-03 NOTE — Evaluation (Signed)
 Physical Therapy Treatment Patient Details Name: Seth Bryant MRN: [id number] DOB: 1946-02-06 Today's Date: 08/03/2024   History of Present Illness Pt is 79 yo male admitted on 08/02/24 with agitation and behavior changes - felt to be related to Alzheimer's disease. Pt with hx including but not limited to mixed dementia, HTN, HLD, anxiety, B12 deficiency, BPPV, chronic pain syndrome, spinal stenosis, benzodiazepine dependence and previous alcohol use disorder    PT Comments  Pt admitted with above diagnosis. At baseline, pt independent.  He lives with his 54 yo mother.  Denies falls other than the one last night in ED when confused.  Pt has been seeing outpt neurology for possible Alzheimer's dementia.  Today, pt is now oriented and following commands, eager to return home.  He was up and dressed at arrival, ambulated 400' without difficulty with good balance. Family present and reports appears close to normal.  Pt has no for with pt's with dementia - notified TOC.      If plan is discharge home, recommend the following:     Can travel by private vehicle        Equipment Recommendations  None recommended by PT    Recommendations for Other Services       Precautions / Restrictions Precautions Precautions: Fall     Mobility  Bed Mobility Overal bed mobility: Independent                  Transfers Overall transfer level: Needs assistance Equipment used: None Transfers: Sit to/from Stand Sit to Stand: Supervision                Ambulation/Gait Ambulation/Gait assistance: Supervision Gait Distance (Feet): 400 Feet Assistive device: None Gait Pattern/deviations: WFL(Within Functional Limits) Gait velocity: normal to fast     General Gait Details: Walking with normal gait pattern   Stairs             Wheelchair Mobility     Tilt Bed    Modified Rankin (Stroke Patients Only)       Balance Overall balance assessment: Needs  assistance Sitting-balance support: No upper extremity supported Sitting balance-Leahy Scale: Normal     Standing balance support: No upper extremity supported Standing balance-Leahy Scale: Good                 High Level Balance Comments: Pt able to march in place, scan environment, navigate obstacle, back step, and turn without LOB            Communication    Cognition Arousal: Alert Behavior During Therapy: WFL for tasks assessed/performed   PT - Cognitive impairments: No apparent impairments                       PT - Cognition Comments: At this time oriented x 4 and following commands;  Mother and nephew report appears at baseline        Cueing    Exercises      General Comments General comments (skin integrity, edema, etc.): Family present and feel pt back to normal and ready to return home - notified TOC      Pertinent Vitals/Pain Pain Assessment Pain Assessment: Faces Faces Pain Scale: Hurts little more (w transfers) Pain Location: L low back Pain Descriptors / Indicators: Discomfort Pain Intervention(s): Limited activity within patient's tolerance, Monitored during session, Heat applied    Home Living Family/patient expects to be discharged to:: Private residence Living Arrangements: Parent (47 yo  mother) Available Help at Discharge: Family;Available 24 hours/day Type of Home: House Home Access: Stairs to enter Entrance Stairs-Rails: Doctor, General Practice of Steps: 3   Home Layout: One level Home Equipment: None      Prior Function            PT Goals (current goals can now be found in the care plan section) Acute Rehab PT Goals Patient Stated Goal: return home PT Goal Formulation: All assessment and education complete, DC therapy    Frequency           PT Plan      Co-evaluation              AM-PAC PT 6 Clicks Mobility   Outcome Measure  Help needed turning from your back to your side while in  a flat bed without using bedrails?: None Help needed moving from lying on your back to sitting on the side of a flat bed without using bedrails?: None Help needed moving to and from a bed to a chair (including a wheelchair)?: None Help needed standing up from a chair using your arms (e.g., wheelchair or bedside chair)?: None Help needed to walk in hospital room?: A Little Help needed climbing 3-5 steps with a railing? : A Little 6 Click Score: 22    End of Session Equipment Utilized During Treatment: Gait belt Activity Tolerance: Patient tolerated treatment well Patient left: in bed;with call bell/phone within reach;with bed alarm set;with family/visitor present (chair alarm pad in bed is on) Nurse Communication: Mobility status PT Visit Diagnosis: Other abnormalities of gait and mobility (R26.89)     Time: 8551-8492 PT Time Calculation (min) (ACUTE ONLY): 19 min  Charges:      PT General Charges $$ ACUTE PT VISIT: 1 Visit                     Benjiman, PT Acute Rehab Services [facility] [phone]    Benjiman VEAR Mulberry 08/03/2024, 3:29 PM

## 2024-08-03 NOTE — Progress Notes (Addendum)
 CSW attempted to contact pt's mother without success. Left HIPAA Compliant voicemail requesting call back.   Addend @ 2:48PM Attempted to contact mother without success. Unable to leave vm. ICM to follow.

## 2024-08-03 NOTE — ED Notes (Signed)
 PT in room with patient.

## 2024-08-03 NOTE — Progress Notes (Signed)
 Caregiver resources added to AVS. No further TOC needs

## 2024-08-03 NOTE — ED Notes (Signed)
 Patient repositioned after placing him back in bed.  Patient had diaper on floor.  Rediapered and placed pajama pants back on patient.  Patient reoriented as to where he is and what is expected.

## 2024-08-03 NOTE — ED Provider Notes (Signed)
 Emergency Medicine Observation Re-evaluation Note  Seth Bryant is a 79 y.o. male, seen on rounds today.  Pt initially presented to the ED for complaints of Altered Mental Status Currently, the patient is ready for discharge.  He has been evaluated by social work and family is ready to take him home.  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  When he is at baseline, they are doing all right but needs some additional help.  Physical Exam  BP 131/73   Pulse 66   Temp 97.7 F (36.5 C) (Oral)   Resp 18   SpO2 99%  Physical Exam General: Alert no distress calm Lungs: Respirations nonlabored Psych: Calm and alert  ED Course / MDM  EKG:EKG Interpretation Date/Time:  Monday August 02 2024 20:02:42 EST Ventricular Rate:  55 PR Interval:  343 QRS Duration:  85 QT Interval:  444 QTC Calculation: 425 R Axis:   68  Text Interpretation: Sinus or ectopic atrial rhythm Prolonged PR interval Borderline low voltage, extremity leads No old tracing to compare Confirmed by Lenor Hollering [phone]) on 08/02/2024 10:07:39 PM  I have reviewed the labs performed to date as well as medications administered while in observation.  Recent changes in the last 24 hours include discharge planning.  Plan  Current plan is for home with family.    Armenta Canning, MD 08/03/24 [phone]

## 2025-01-06 ENCOUNTER — Ambulatory Visit: Admitting: Family Medicine

## 2025-02-24 ENCOUNTER — Ambulatory Visit: Payer: Self-pay | Admitting: Physician Assistant

## 2025-07-29 ENCOUNTER — Ambulatory Visit
